# Patient Record
Sex: Female | Born: 1945 | Race: White | Hispanic: No | Marital: Married | Smoking: Never smoker
Health system: Southern US, Community
[De-identification: ages and names within clinical notes are randomized; demographics above are authoritative.]

## PROBLEM LIST (undated history)

## (undated) DIAGNOSIS — K219 Gastro-esophageal reflux disease without esophagitis: Secondary | ICD-10-CM

## (undated) DIAGNOSIS — I471 Supraventricular tachycardia, unspecified: Secondary | ICD-10-CM

## (undated) DIAGNOSIS — F419 Anxiety disorder, unspecified: Secondary | ICD-10-CM

## (undated) DIAGNOSIS — L719 Rosacea, unspecified: Secondary | ICD-10-CM

## (undated) DIAGNOSIS — M5430 Sciatica, unspecified side: Secondary | ICD-10-CM

## (undated) DIAGNOSIS — K589 Irritable bowel syndrome without diarrhea: Secondary | ICD-10-CM

## (undated) DIAGNOSIS — N809 Endometriosis, unspecified: Secondary | ICD-10-CM

## (undated) DIAGNOSIS — M1990 Unspecified osteoarthritis, unspecified site: Secondary | ICD-10-CM

## (undated) DIAGNOSIS — E039 Hypothyroidism, unspecified: Secondary | ICD-10-CM

## (undated) HISTORY — PX: UPPER GASTROINTESTINAL ENDOSCOPY: SHX188

## (undated) HISTORY — PX: COLONOSCOPY: SHX174

## (undated) HISTORY — PX: THYROID SURGERY: SHX805

## (undated) HISTORY — PX: DILATION AND CURETTAGE OF UTERUS: SHX78

## (undated) HISTORY — PX: TONSILLECTOMY: SUR1361

## (undated) HISTORY — PX: BREAST BIOPSY: SHX20

## (undated) HISTORY — PX: ADENOIDECTOMY: SUR15

## (undated) HISTORY — PX: ABDOMINAL HYSTERECTOMY: SHX81

## (undated) HISTORY — PX: APPENDECTOMY: SHX54

## (undated) HISTORY — PX: RIGHT OOPHORECTOMY: SHX2359

## (undated) HISTORY — PX: WISDOM TOOTH EXTRACTION: SHX21

---

## 2012-04-29 ENCOUNTER — Emergency Department (HOSPITAL_BASED_OUTPATIENT_CLINIC_OR_DEPARTMENT_OTHER)
Admission: EM | Admit: 2012-04-29 | Discharge: 2012-04-29 | Disposition: A | Discharge status: Home / Self Care | Payer: Medicare Other | Attending: Emergency Medicine | Admitting: Emergency Medicine

## 2012-04-29 ENCOUNTER — Encounter (HOSPITAL_BASED_OUTPATIENT_CLINIC_OR_DEPARTMENT_OTHER): Payer: Self-pay | Admitting: *Deleted

## 2012-04-29 ENCOUNTER — Emergency Department (HOSPITAL_BASED_OUTPATIENT_CLINIC_OR_DEPARTMENT_OTHER): Payer: Medicare Other

## 2012-04-29 DIAGNOSIS — Z79899 Other long term (current) drug therapy: Secondary | ICD-10-CM | POA: Insufficient documentation

## 2012-04-29 DIAGNOSIS — E039 Hypothyroidism, unspecified: Secondary | ICD-10-CM | POA: Insufficient documentation

## 2012-04-29 DIAGNOSIS — Y9301 Activity, walking, marching and hiking: Secondary | ICD-10-CM | POA: Insufficient documentation

## 2012-04-29 DIAGNOSIS — S8000XA Contusion of unspecified knee, initial encounter: Secondary | ICD-10-CM

## 2012-04-29 DIAGNOSIS — W1789XA Other fall from one level to another, initial encounter: Secondary | ICD-10-CM | POA: Insufficient documentation

## 2012-04-29 DIAGNOSIS — S82899A Other fracture of unspecified lower leg, initial encounter for closed fracture: Secondary | ICD-10-CM

## 2012-04-29 DIAGNOSIS — Y9229 Other specified public building as the place of occurrence of the external cause: Secondary | ICD-10-CM | POA: Insufficient documentation

## 2012-04-29 DIAGNOSIS — Z8659 Personal history of other mental and behavioral disorders: Secondary | ICD-10-CM | POA: Insufficient documentation

## 2012-04-29 DIAGNOSIS — K219 Gastro-esophageal reflux disease without esophagitis: Secondary | ICD-10-CM | POA: Insufficient documentation

## 2012-04-29 DIAGNOSIS — Y929 Unspecified place or not applicable: Secondary | ICD-10-CM | POA: Insufficient documentation

## 2012-04-29 DIAGNOSIS — Z8679 Personal history of other diseases of the circulatory system: Secondary | ICD-10-CM | POA: Insufficient documentation

## 2012-04-29 HISTORY — DX: Supraventricular tachycardia, unspecified: I47.10

## 2012-04-29 HISTORY — DX: Irritable bowel syndrome without diarrhea: K58.9

## 2012-04-29 HISTORY — DX: Anxiety disorder, unspecified: F41.9

## 2012-04-29 HISTORY — DX: Hypothyroidism, unspecified: E03.9

## 2012-04-29 HISTORY — DX: Gastro-esophageal reflux disease without esophagitis: K21.9

## 2012-04-29 HISTORY — DX: Rosacea, unspecified: L71.9

## 2012-04-29 HISTORY — DX: Sciatica, unspecified side: M54.30

## 2012-04-29 HISTORY — DX: Endometriosis, unspecified: N80.9

## 2012-04-29 HISTORY — DX: Supraventricular tachycardia: I47.1

## 2012-04-29 MED ORDER — HYDROCODONE-ACETAMINOPHEN 5-325 MG PO TABS
1.0000 | ORAL_TABLET | ORAL | Status: DC | PRN
Start: 2012-04-29 — End: 2017-02-16

## 2012-04-29 MED ORDER — ACETAMINOPHEN 325 MG PO TABS
650.0000 mg | ORAL_TABLET | Freq: Once | ORAL | Status: AC
  Administered 2012-04-29: 650 mg via ORAL
  Filled 2012-04-29: qty 2

## 2012-04-29 NOTE — ED Notes (Signed)
MD at bedside. 

## 2012-04-29 NOTE — ED Notes (Signed)
Pt d/c home with her husband- Cam walker and crutches applied by Weston Brass, EMT- ice pack given for home use- pt states she will f/u with her PCP next week

## 2012-04-29 NOTE — ED Notes (Signed)
Pt EMS transport from restaurant, tripped on curb- has swelling to right ankle and abrasions to both knees and left ankle- swelling to upper lip- no LOC or active bleeding

## 2012-04-29 NOTE — ED Provider Notes (Addendum)
History     CSN: [id number]  Arrival date & time 04/29/12  1638   First MD Initiated Contact with Patient 04/29/12 1648      Chief Complaint  Patient presents with  . Fall  . Ankle Pain    (Consider location/radiation/quality/duration/timing/severity/associated sxs/prior treatment) Patient is a 66 y.o. female presenting with fall and ankle pain. The history is provided by the patient.  Fall The accident occurred less than 1 hour ago. The fall occurred while walking (Tripped over the curb). She fell from a height of 1 to 2 ft. She landed on concrete. There was no blood loss. The point of impact was the left knee. The pain is present in the left knee (Right ankle). The pain is at a severity of 9/10. The pain is severe. She was not ambulatory at the scene. Pertinent negatives include no visual change, no bowel incontinence, no nausea, no vomiting, no headaches, no loss of consciousness and no tingling. The symptoms are aggravated by use of the injured limb, pressure on the injury and standing. She has tried nothing for the symptoms. The treatment provided no relief.  Ankle Pain  Pertinent negatives include no tingling.    Past Medical History  Diagnosis Date  . SVT (supraventricular tachycardia)   . Hypothyroid   . Anxiety   . GERD (gastroesophageal reflux disease)     No past surgical history on file.  No family history on file.  History  Substance Use Topics  . Smoking status: Not on file  . Smokeless tobacco: Not on file  . Alcohol Use:     OB History    Grav Para Term Preterm Abortions TAB SAB Ect Mult Living                  Review of Systems  Gastrointestinal: Negative for nausea, vomiting and bowel incontinence.  Neurological: Negative for tingling, loss of consciousness and headaches.  All other systems reviewed and are negative.    Allergies  Nsaids and Prednisone  Home Medications   Current Outpatient Rx  Name  Route  Sig  Dispense  Refill  .  CYCLOSPORINE 0.05 % OP EMUL      1 drop 2 (two) times daily.         Marland Kitchen NEURONTIN PO   Oral   Take by mouth.         . SYNTHROID PO   Oral   Take by mouth.         Marland Kitchen PRILOSEC PO   Oral   Take by mouth.           There were no vitals taken for this visit.  Physical Exam  Nursing note and vitals reviewed. Constitutional: She is oriented to person, place, and time. She appears well-developed and well-nourished. No distress.  HENT:  Head: Normocephalic and atraumatic.    Mouth/Throat: Oropharynx is clear and moist.       Contusion to the left upper lip  Eyes: Conjunctivae normal and EOM are normal. Pupils are equal, round, and reactive to light.  Neck: Normal range of motion. Neck supple.  Cardiovascular: Normal rate, regular rhythm and intact distal pulses.   No murmur heard. Pulmonary/Chest: Effort normal and breath sounds normal. No respiratory distress. She has no wheezes. She has no rales.  Abdominal: Soft. She exhibits no distension. There is no tenderness. There is no rebound and no guarding.  Musculoskeletal: She exhibits no edema and no tenderness.  Right hip: She exhibits tenderness. She exhibits normal range of motion, normal strength and no deformity.       Left knee: She exhibits swelling and ecchymosis. She exhibits normal range of motion, no effusion, no deformity, normal alignment, no LCL laxity and no MCL laxity. tenderness found. Medial joint line and lateral joint line tenderness noted.       Right ankle: She exhibits decreased range of motion, swelling, ecchymosis and deformity. tenderness. Lateral malleolus tenderness found.       Cervical back: Normal.       Thoracic back: Normal.       Lumbar back: Normal.       Legs: Neurological: She is alert and oriented to person, place, and time.  Skin: Skin is warm and dry. No rash noted. No erythema.  Psychiatric: She has a normal mood and affect. Her behavior is normal.    ED Course  Procedures  (including critical care time)  Labs Reviewed - No data to display Dg Ankle Complete Right  04/29/2012  *RADIOLOGY REPORT*  Clinical Data: Lateral ankle pain and swelling after fall.  RIGHT ANKLE - COMPLETE 3+ VIEW  Comparison: None.  Findings: There is a small avulsion from the tip of the lateral malleolus.  There is soft tissue swelling with an ankle effusion. No other abnormalities.  IMPRESSION: Small avulsion from the tip of the lateral malleolus.  Ankle effusion.   Original Report Authenticated By: Francene Boyers, M.D.    Dg Knee Complete 4 Views Left  04/29/2012  *RADIOLOGY REPORT*  Clinical Data: Knee injury, pain, and bruising.  LEFT KNEE - COMPLETE 4+ VIEW  Comparison:  None.  Findings:  There is no evidence of fracture, dislocation, or joint effusion.  There is no evidence of arthropathy or other focal bone abnormality.  Soft tissues are unremarkable.  IMPRESSION: Negative.   Original Report Authenticated By: Myles Rosenthal, M.D.      1. Avulsion fracture of ankle   2. Knee contusion       MDM   Patient with a mechanical fall today where she tripped over the curb and fell twisting her right ankle and landing on her left knee. Pain and ecchymosis over the left knee and right ankle. Also contusion to the upper lip but no dental injury. No C-spine tenderness. No head injury or LOC. Patient is not on anticoagulation. Plain films of the left knee and right ankle pending  5:59 PM Plain films significant for lateral malleolus avulsion fx.  Will place in a cam walker and give pain control.  Knee wnl.      Gwyneth Sprout, MD 04/29/12 1759  Gwyneth Sprout, MD 04/29/12 1610

## 2017-02-15 ENCOUNTER — Inpatient Hospital Stay (HOSPITAL_BASED_OUTPATIENT_CLINIC_OR_DEPARTMENT_OTHER)
Admission: EM | Admit: 2017-02-15 | Discharge: 2017-02-24 | DRG: 392 | Disposition: A | Discharge status: Nursing Home (Includes ICF) | Payer: Medicare Other | Attending: Family Medicine | Admitting: Family Medicine

## 2017-02-15 ENCOUNTER — Encounter (HOSPITAL_BASED_OUTPATIENT_CLINIC_OR_DEPARTMENT_OTHER): Payer: Self-pay

## 2017-02-15 DIAGNOSIS — K56609 Unspecified intestinal obstruction, unspecified as to partial versus complete obstruction: Secondary | ICD-10-CM | POA: Diagnosis present

## 2017-02-15 DIAGNOSIS — M7989 Other specified soft tissue disorders: Secondary | ICD-10-CM | POA: Diagnosis present

## 2017-02-15 DIAGNOSIS — Z7951 Long term (current) use of inhaled steroids: Secondary | ICD-10-CM

## 2017-02-15 DIAGNOSIS — E876 Hypokalemia: Secondary | ICD-10-CM | POA: Diagnosis present

## 2017-02-15 DIAGNOSIS — F419 Anxiety disorder, unspecified: Secondary | ICD-10-CM | POA: Diagnosis present

## 2017-02-15 DIAGNOSIS — Z8542 Personal history of malignant neoplasm of other parts of uterus: Secondary | ICD-10-CM

## 2017-02-15 DIAGNOSIS — Z978 Presence of other specified devices: Secondary | ICD-10-CM

## 2017-02-15 DIAGNOSIS — R109 Unspecified abdominal pain: Secondary | ICD-10-CM

## 2017-02-15 DIAGNOSIS — R112 Nausea with vomiting, unspecified: Secondary | ICD-10-CM | POA: Diagnosis not present

## 2017-02-15 DIAGNOSIS — R52 Pain, unspecified: Secondary | ICD-10-CM

## 2017-02-15 DIAGNOSIS — Z90721 Acquired absence of ovaries, unilateral: Secondary | ICD-10-CM

## 2017-02-15 DIAGNOSIS — E039 Hypothyroidism, unspecified: Secondary | ICD-10-CM | POA: Diagnosis present

## 2017-02-15 DIAGNOSIS — Z90710 Acquired absence of both cervix and uterus: Secondary | ICD-10-CM

## 2017-02-15 DIAGNOSIS — K529 Noninfective gastroenteritis and colitis, unspecified: Secondary | ICD-10-CM

## 2017-02-15 DIAGNOSIS — F329 Major depressive disorder, single episode, unspecified: Secondary | ICD-10-CM | POA: Diagnosis present

## 2017-02-15 DIAGNOSIS — A09 Infectious gastroenteritis and colitis, unspecified: Principal | ICD-10-CM | POA: Diagnosis present

## 2017-02-15 DIAGNOSIS — K589 Irritable bowel syndrome without diarrhea: Secondary | ICD-10-CM | POA: Diagnosis present

## 2017-02-15 DIAGNOSIS — K219 Gastro-esophageal reflux disease without esophagitis: Secondary | ICD-10-CM | POA: Diagnosis present

## 2017-02-15 HISTORY — DX: Unspecified osteoarthritis, unspecified site: M19.90

## 2017-02-15 MED ORDER — FENTANYL CITRATE (PF) 100 MCG/2ML IJ SOLN
50.0000 ug | Freq: Once | INTRAMUSCULAR | Status: AC
  Administered 2017-02-16: 50 ug via INTRAVENOUS
  Filled 2017-02-15: qty 2

## 2017-02-15 MED ORDER — SODIUM CHLORIDE 0.9 % IV SOLN
INTRAVENOUS | Status: DC
  Administered 2017-02-16 – 2017-02-20 (×9): via INTRAVENOUS

## 2017-02-15 MED ORDER — ONDANSETRON HCL 4 MG/2ML IJ SOLN
4.0000 mg | Freq: Once | INTRAMUSCULAR | Status: AC
  Administered 2017-02-16: 4 mg via INTRAVENOUS
  Filled 2017-02-15: qty 2

## 2017-02-15 NOTE — ED Triage Notes (Signed)
Pt states took miralax this evening and since 1900 vomit x2 and diarrhea x4

## 2017-02-15 NOTE — ED Notes (Signed)
Per [facility] pt from [facility] independent living; pt c/o n/v/d since 1900

## 2017-02-15 NOTE — ED Provider Notes (Signed)
[facility] Provider Note: Lowella Dell, MD, FACEP  CSN: [id number] MRN: [id number] ARRIVAL: 02/15/17 at 2336 ROOM: MH04/MH04   CHIEF COMPLAINT  Abdominal Pain   HISTORY OF PRESENT ILLNESS  02/15/17 11:45 PM Cheyenne Cole is a 71 y.o. female who developed left lower quadrant abdominal pain about 8 PM. She describes the pain as crampy and rates it an 8 out of 10. It is somewhat worse with movement or palpation. She has had about 2 episodes of dry heaves followed by episodes of vomiting. She threw up very little material. She is also had several bowel movements all but one of which was performed; the most recent one was loose. She has not had a fever. She denies chest pain or shortness of breath. She had taken MiraLAX earlier because of the sensation that she needed to void her bowels.   Past Medical History:  Diagnosis Date  . Anxiety   . Arthritis   . Endometriosis   . GERD (gastroesophageal reflux disease)   . Hypothyroid   . IBS (irritable bowel syndrome)   . Rosacea   . Sciatica   . SVT (supraventricular tachycardia) (HCC)     Past Surgical History:  Procedure Laterality Date  . ABDOMINAL HYSTERECTOMY    . ADENOIDECTOMY    . APPENDECTOMY    . BREAST BIOPSY    . COLONOSCOPY    . DILATION AND CURETTAGE OF UTERUS    . RIGHT OOPHORECTOMY    . THYROID SURGERY    . TONSILLECTOMY    . UPPER GASTROINTESTINAL ENDOSCOPY    . WISDOM TOOTH EXTRACTION      No family history on file.  Social History  Substance Use Topics  . Smoking status: Never Smoker  . Smokeless tobacco: Never Used  . Alcohol use 0.0 oz/week    Prior to Admission medications   Medication Sig Start Date End Date Taking? Authorizing Provider  acetaminophen (TYLENOL) 325 MG tablet Take 650 mg by mouth every 6 (six) hours as needed.    [provider]  busPIRone (BUSPAR) 5 MG tablet Take 5 mg by mouth 2 (two) times daily.    [provider]  chlorpheniramine  (CHLOR-TRIMETON) 2 MG/5ML syrup Take 2 mg by mouth every 4 (four) hours as needed.    [provider]  Cholecalciferol (VITAMIN D PO) Take 1,700 Int'l Units by mouth daily.    [provider]  cycloSPORINE (RESTASIS) 0.05 % ophthalmic emulsion 1 drop daily.     [provider]  Docusate Calcium (STOOL SOFTENER PO) Take by mouth.    [provider]  escitalopram (LEXAPRO) 10 MG tablet Take 10 mg by mouth 2 (two) times daily before a meal.  with evening dose    [provider]  FIBER FORMULA PO Take by mouth.    [provider]  fluticasone (FLONASE) 50 MCG/ACT nasal spray Place 2 sprays into the nose daily.    [provider]  Gabapentin (NEURONTIN PO) Take 200 mg by mouth every evening.     [provider]  HYDROcodone-acetaminophen (NORCO/VICODIN) 5-325 MG per tablet Take 1 tablet by mouth every 4 (four) hours as needed for pain. 04/29/12   Gwyneth Sprout, MD  HYDROXYZINE HCL PO Take by mouth.    [provider]  ibuprofen (ADVIL,MOTRIN) 200 MG tablet Take 200 mg by mouth every 6 (six) hours as needed.    [provider]  Levothyroxine Sodium (SYNTHROID PO) Take 500 mcg by mouth daily.  [provider]  Melatonin 3 MG TABS Take 3 mg by mouth every evening.    [provider]  MetroNIDAZOLE (METROGEL EX) Apply topically daily.    [provider]  Omeprazole (PRILOSEC PO) Take 20 mg by mouth daily.     [provider]  polyethylene glycol (MIRALAX / GLYCOLAX) packet Take 17 g by mouth daily.    [provider]  Polyvinyl Alcohol-Povidone (REFRESH OP) Apply to eye.    [provider]  Probiotic Product (PROBIOTIC DAILY PO) Take by mouth.    [provider]  sodium chloride (OCEAN) 0.65 % nasal spray Place 1 spray into the nose as needed.    [provider]  TRIAMCINOLONE & EMOLLIENT EX Apply topically.    [provider]    VALACYCLOVIR HCL PO Take by mouth.    [provider]    Allergies Ciprofloxacin; Nsaids; Prednisone; and Wellbutrin [bupropion]   REVIEW OF SYSTEMS  Negative except as noted here or in the History of Present Illness.   PHYSICAL EXAMINATION  Initial Vital Signs There were no vitals taken for this visit.  Examination General: Well-developed, well-nourished female in no acute distress; appearance consistent with age of record HENT: normocephalic; atraumatic Eyes: pupils equal, round and reactive to light; extraocular muscles intact Neck: supple Heart: regular rate and rhythm Lungs: clear to auscultation bilaterally Abdomen: soft; nondistended; left lower quadrant tenderness; no masses or hepatosplenomegaly; bowel sounds hypoactive Extremities: No deformity; full range of motion; pulses normal Neurologic: Awake, alert and oriented; motor function intact in all extremities and symmetric; no facial droop Skin: Warm and dry Psychiatric: Flat affect   RESULTS  Summary of this visit's results, reviewed by myself:   EKG Interpretation  Date/Time:    Ventricular Rate:    PR Interval:    QRS Duration:   QT Interval:    QTC Calculation:   R Axis:     Text Interpretation:        Laboratory Studies: Results for orders placed or performed during the hospital encounter of 02/15/17 (from the past 24 hour(s))  CBC with Differential/Platelet     Status: None   Collection Time: 02/16/17 12:02 AM  Result Value Ref Range   WBC 8.7 4.0 - 10.5 K/uL   RBC 4.38 3.87 - 5.11 MIL/uL   Hemoglobin 13.7 12.0 - 15.0 g/dL   HCT 16.1 09.6 - 04.5 %   MCV 92.0 78.0 - 100.0 fL   MCH 31.3 26.0 - 34.0 pg   MCHC 34.0 30.0 - 36.0 g/dL   RDW 40.9 81.1 - 91.4 %   Platelets 249 150 - 400 K/uL   Neutrophils Relative % 80 %   Neutro Abs 6.9 1.7 - 7.7 K/uL   Lymphocytes Relative 11 %   Lymphs Abs 1.0 0.7 - 4.0 K/uL   Monocytes Relative 7 %   Monocytes Absolute 0.6 0.1 - 1.0 K/uL    Eosinophils Relative 1 %   Eosinophils Absolute 0.1 0.0 - 0.7 K/uL   Basophils Relative 1 %   Basophils Absolute 0.1 0.0 - 0.1 K/uL  Basic metabolic panel     Status: Abnormal   Collection Time: 02/16/17 12:02 AM  Result Value Ref Range   Sodium 132 (L) 135 - 145 mmol/L   Potassium 2.8 (L) 3.5 - 5.1 mmol/L   Chloride 94 (L) 101 - 111 mmol/L   CO2 28 22 - 32 mmol/L   Glucose, Bld 150 (H) 65 - 99 mg/dL   BUN  17 6 - 20 mg/dL   Creatinine, Ser 1.61 0.44 - 1.00 mg/dL   Calcium 9.1 8.9 - 09.6 mg/dL   GFR calc non Af Amer >60 >60 mL/min   GFR calc Af Amer >60 >60 mL/min   Anion gap 10 5 - 15  Urinalysis, Routine w reflex microscopic     Status: Abnormal   Collection Time: 02/16/17 12:35 AM  Result Value Ref Range   Color, Urine YELLOW YELLOW   APPearance CLOUDY (A) CLEAR   Specific Gravity, Urine 1.010 1.005 - 1.030   pH 8.5 (H) 5.0 - 8.0   Glucose, UA NEGATIVE NEGATIVE mg/dL   Hgb urine dipstick SMALL (A) NEGATIVE   Bilirubin Urine NEGATIVE NEGATIVE   Ketones, ur 15 (A) NEGATIVE mg/dL   Protein, ur NEGATIVE NEGATIVE mg/dL   Nitrite NEGATIVE NEGATIVE   Leukocytes, UA SMALL (A) NEGATIVE  Urinalysis, Microscopic (reflex)     Status: Abnormal   Collection Time: 02/16/17 12:35 AM  Result Value Ref Range   RBC / HPF 0-5 0 - 5 RBC/hpf   WBC, UA 0-5 0 - 5 WBC/hpf   Bacteria, UA FEW (A) NONE SEEN   Squamous Epithelial / LPF 6-30 (A) NONE SEEN   Amorphous Crystal PRESENT    Imaging Studies: Ct Abdomen Pelvis W Contrast  Result Date: 02/16/2017 CLINICAL DATA:  Nausea, vomiting, and diarrhea. Bloating. Prior colonoscopy with polyp removal. History of uterine cancer. Hematuria. EXAM: CT ABDOMEN AND PELVIS WITH CONTRAST TECHNIQUE: Multidetector CT imaging of the abdomen and pelvis was performed using the standard protocol following bolus administration of intravenous contrast. CONTRAST:  ISOVUE-300 IOPAMIDOL (ISOVUE-300) INJECTION 61% COMPARISON:  None. FINDINGS: Lower chest: The lung  bases are clear. Hepatobiliary: No focal liver abnormality is seen. No gallstones, gallbladder wall thickening, or biliary dilatation. Pancreas: Unremarkable. No pancreatic ductal dilatation or surrounding inflammatory changes. Spleen: Normal in size without focal abnormality. Adrenals/Urinary Tract: Adrenal glands are unremarkable. Kidneys are normal, without renal calculi, focal lesion, or hydronephrosis. Bladder is unremarkable. Stomach/Bowel: Stomach is not abnormally distended and no wall thickening is appreciated. There is mild a distention of fluid-filled small bowel particularly in the lower abdomen and pelvis. There is mild bowel wall thickening with a prominent thick walled bowel loop in the pelvis. There is decompression of the terminal ileum with transition zone in the pelvis or right lower quadrant. Changes are consistent with small bowel obstruction. Wall thickening suggests superimposed or pre-existing enteritis or inflammatory bowel disease. Can't exclude stricture from Crohn's disease. No pneumatosis or portal venous gas to suggest ischemia. Colon is mostly decompressed. No inflammatory changes in the colon. Appendix is not identified. Vascular/Lymphatic: Aortic atherosclerosis. No enlarged abdominal or pelvic lymph nodes. Reproductive: Status post hysterectomy. No adnexal masses. Other: No free air in the abdomen. Small amount of free fluid around the liver, possibly indicating ascites. Musculoskeletal: Lumbar scoliosis convex towards the right. No destructive bone lesions. IMPRESSION: 1. Mildly dilated fluid-filled small bowel with distal decompression and small bowel wall thickening. Changes consistent with small bowel obstruction. Coexisting enteritis. Can't exclude stricture due to Crohn's disease. Transition zone in the right lower quadrant pelvis. 2. No evidence of diverticulitis. 3. Aortic atherosclerosis. 4. Small amount of ascites around the liver. Electronically Signed   By: Burman Nieves M.D.   On: 02/16/2017 02:16    ED COURSE  Nursing notes and initial vitals signs, including pulse oximetry, reviewed.  Vitals:   02/15/17 2345  BP: (!) 165/85  Pulse: 77  Resp: 18  Temp: 97.7 F (36.5 C)  TempSrc: Oral  SpO2: 100%    PROCEDURES    ED DIAGNOSES     ICD-10-CM   1. Small bowel obstruction (HCC) K56.609   2. Enteritis K52.9   3. Hypokalemia due to loss of potassium E87.6        Milany Geck, Jonny Ruiz, MD 02/16/17 [phone]

## 2017-02-16 ENCOUNTER — Emergency Department (HOSPITAL_BASED_OUTPATIENT_CLINIC_OR_DEPARTMENT_OTHER): Payer: Medicare Other

## 2017-02-16 DIAGNOSIS — A09 Infectious gastroenteritis and colitis, unspecified: Secondary | ICD-10-CM | POA: Diagnosis present

## 2017-02-16 DIAGNOSIS — E876 Hypokalemia: Secondary | ICD-10-CM | POA: Diagnosis present

## 2017-02-16 DIAGNOSIS — M7989 Other specified soft tissue disorders: Secondary | ICD-10-CM | POA: Diagnosis present

## 2017-02-16 DIAGNOSIS — K219 Gastro-esophageal reflux disease without esophagitis: Secondary | ICD-10-CM | POA: Diagnosis present

## 2017-02-16 DIAGNOSIS — R52 Pain, unspecified: Secondary | ICD-10-CM | POA: Diagnosis not present

## 2017-02-16 DIAGNOSIS — Z90721 Acquired absence of ovaries, unilateral: Secondary | ICD-10-CM | POA: Diagnosis not present

## 2017-02-16 DIAGNOSIS — Z978 Presence of other specified devices: Secondary | ICD-10-CM | POA: Diagnosis not present

## 2017-02-16 DIAGNOSIS — E039 Hypothyroidism, unspecified: Secondary | ICD-10-CM | POA: Diagnosis present

## 2017-02-16 DIAGNOSIS — K56609 Unspecified intestinal obstruction, unspecified as to partial versus complete obstruction: Secondary | ICD-10-CM | POA: Diagnosis present

## 2017-02-16 DIAGNOSIS — Z8542 Personal history of malignant neoplasm of other parts of uterus: Secondary | ICD-10-CM | POA: Diagnosis not present

## 2017-02-16 DIAGNOSIS — K529 Noninfective gastroenteritis and colitis, unspecified: Secondary | ICD-10-CM | POA: Diagnosis not present

## 2017-02-16 DIAGNOSIS — F329 Major depressive disorder, single episode, unspecified: Secondary | ICD-10-CM | POA: Diagnosis present

## 2017-02-16 DIAGNOSIS — R112 Nausea with vomiting, unspecified: Secondary | ICD-10-CM | POA: Diagnosis present

## 2017-02-16 DIAGNOSIS — Z90710 Acquired absence of both cervix and uterus: Secondary | ICD-10-CM | POA: Diagnosis not present

## 2017-02-16 DIAGNOSIS — F419 Anxiety disorder, unspecified: Secondary | ICD-10-CM | POA: Diagnosis present

## 2017-02-16 DIAGNOSIS — Z7951 Long term (current) use of inhaled steroids: Secondary | ICD-10-CM | POA: Diagnosis not present

## 2017-02-16 DIAGNOSIS — R1084 Generalized abdominal pain: Secondary | ICD-10-CM | POA: Diagnosis not present

## 2017-02-16 DIAGNOSIS — K589 Irritable bowel syndrome without diarrhea: Secondary | ICD-10-CM | POA: Diagnosis present

## 2017-02-16 DIAGNOSIS — R109 Unspecified abdominal pain: Secondary | ICD-10-CM | POA: Diagnosis not present

## 2017-02-16 LAB — URINALYSIS, ROUTINE W REFLEX MICROSCOPIC
BILIRUBIN URINE: NEGATIVE
Glucose, UA: NEGATIVE mg/dL
Ketones, ur: 15 mg/dL — AB
NITRITE: NEGATIVE
PH: 8.5 — AB (ref 5.0–8.0)
Protein, ur: NEGATIVE mg/dL
SPECIFIC GRAVITY, URINE: 1.01 (ref 1.005–1.030)

## 2017-02-16 LAB — CBC
HCT: 44.7 % (ref 36.0–46.0)
Hemoglobin: 15.6 g/dL — ABNORMAL HIGH (ref 12.0–15.0)
MCH: 31.8 pg (ref 26.0–34.0)
MCHC: 34.9 g/dL (ref 30.0–36.0)
MCV: 91 fL (ref 78.0–100.0)
Platelets: 226 10*3/uL (ref 150–400)
RBC: 4.91 MIL/uL (ref 3.87–5.11)
RDW: 13.5 % (ref 11.5–15.5)
WBC: 22.3 10*3/uL — AB (ref 4.0–10.5)

## 2017-02-16 LAB — CBC WITH DIFFERENTIAL/PLATELET
BASOS PCT: 1 %
Basophils Absolute: 0.1 10*3/uL (ref 0.0–0.1)
Eosinophils Absolute: 0.1 10*3/uL (ref 0.0–0.7)
Eosinophils Relative: 1 %
HEMATOCRIT: 40.3 % (ref 36.0–46.0)
HEMOGLOBIN: 13.7 g/dL (ref 12.0–15.0)
Lymphocytes Relative: 11 %
Lymphs Abs: 1 10*3/uL (ref 0.7–4.0)
MCH: 31.3 pg (ref 26.0–34.0)
MCHC: 34 g/dL (ref 30.0–36.0)
MCV: 92 fL (ref 78.0–100.0)
MONOS PCT: 7 %
Monocytes Absolute: 0.6 10*3/uL (ref 0.1–1.0)
NEUTROS ABS: 6.9 10*3/uL (ref 1.7–7.7)
NEUTROS PCT: 80 %
Platelets: 249 10*3/uL (ref 150–400)
RBC: 4.38 MIL/uL (ref 3.87–5.11)
RDW: 12.9 % (ref 11.5–15.5)
WBC: 8.7 10*3/uL (ref 4.0–10.5)

## 2017-02-16 LAB — LACTIC ACID, PLASMA: Lactic Acid, Venous: 2 mmol/L (ref 0.5–1.9)

## 2017-02-16 LAB — HEPATIC FUNCTION PANEL
ALT: 18 U/L (ref 14–54)
AST: 27 U/L (ref 15–41)
Albumin: 4.4 g/dL (ref 3.5–5.0)
Alkaline Phosphatase: 72 U/L (ref 38–126)
TOTAL PROTEIN: 7.6 g/dL (ref 6.5–8.1)
Total Bilirubin: 0.3 mg/dL (ref 0.3–1.2)

## 2017-02-16 LAB — BASIC METABOLIC PANEL
Anion gap: 10 (ref 5–15)
BUN: 17 mg/dL (ref 6–20)
CHLORIDE: 94 mmol/L — AB (ref 101–111)
CO2: 28 mmol/L (ref 22–32)
CREATININE: 0.52 mg/dL (ref 0.44–1.00)
Calcium: 9.1 mg/dL (ref 8.9–10.3)
GFR calc Af Amer: >60 mL/min (ref >60)
GFR calc non Af Amer: >60 mL/min (ref >60)
Glucose, Bld: 150 mg/dL — ABNORMAL HIGH (ref 65–99)
POTASSIUM: 2.8 mmol/L — AB (ref 3.5–5.1)
SODIUM: 132 mmol/L — AB (ref 135–145)

## 2017-02-16 LAB — TYPE AND SCREEN
ABO/RH(D): A POS
Antibody Screen: NEGATIVE

## 2017-02-16 LAB — URINALYSIS, MICROSCOPIC (REFLEX)

## 2017-02-16 MED ORDER — CYCLOSPORINE 0.05 % OP EMUL
1.0000 [drp] | Freq: Two times a day (BID) | OPHTHALMIC | Status: DC
  Filled 2017-02-16: qty 1

## 2017-02-16 MED ORDER — LEVOTHYROXINE SODIUM 50 MCG PO TABS
50.0000 ug | ORAL_TABLET | Freq: Every day | ORAL | Status: DC
  Administered 2017-02-16 – 2017-02-21 (×6): 50 ug via ORAL
  Filled 2017-02-16 (×6): qty 1

## 2017-02-16 MED ORDER — ACETAMINOPHEN 325 MG PO TABS
650.0000 mg | ORAL_TABLET | Freq: Four times a day (QID) | ORAL | Status: DC | PRN
Start: 2017-02-16 — End: 2017-02-24
  Administered 2017-02-16 – 2017-02-22 (×10): 650 mg via ORAL
  Filled 2017-02-16 (×10): qty 2

## 2017-02-16 MED ORDER — METRONIDAZOLE IN NACL 5-0.79 MG/ML-% IV SOLN
500.0000 mg | Freq: Three times a day (TID) | INTRAVENOUS | Status: DC
  Administered 2017-02-16 – 2017-02-23 (×22): 500 mg via INTRAVENOUS
  Filled 2017-02-16 (×23): qty 100

## 2017-02-16 MED ORDER — ONDANSETRON HCL 4 MG PO TABS
4.0000 mg | ORAL_TABLET | Freq: Four times a day (QID) | ORAL | Status: DC | PRN
  Administered 2017-02-19: 4 mg via ORAL
  Filled 2017-02-16: qty 1

## 2017-02-16 MED ORDER — CYCLOSPORINE 0.05 % OP EMUL
1.0000 [drp] | Freq: Two times a day (BID) | OPHTHALMIC | Status: DC
  Administered 2017-02-16 – 2017-02-24 (×14): 1 [drp] via OPHTHALMIC
  Filled 2017-02-16 (×18): qty 1

## 2017-02-16 MED ORDER — SODIUM CHLORIDE 0.9 % IV BOLUS (SEPSIS)
250.0000 mL | Freq: Once | INTRAVENOUS | Status: AC
  Administered 2017-02-16: 250 mL via INTRAVENOUS

## 2017-02-16 MED ORDER — IOPAMIDOL (ISOVUE-300) INJECTION 61%
100.0000 mL | Freq: Once | INTRAVENOUS | Status: AC | PRN
  Administered 2017-02-16: 100 mL via INTRAVENOUS

## 2017-02-16 MED ORDER — HYDROMORPHONE HCL 1 MG/ML IJ SOLN
0.5000 mg | INTRAMUSCULAR | Status: DC | PRN
Start: 2017-02-16 — End: 2017-02-24
  Administered 2017-02-16 – 2017-02-22 (×11): 0.5 mg via INTRAVENOUS
  Filled 2017-02-16 (×11): qty 1

## 2017-02-16 MED ORDER — PROMETHAZINE HCL 25 MG/ML IJ SOLN
12.5000 mg | Freq: Once | INTRAMUSCULAR | Status: AC
  Administered 2017-02-16: 12.5 mg via INTRAVENOUS

## 2017-02-16 MED ORDER — CEFTRIAXONE SODIUM IN DEXTROSE 40 MG/ML IV SOLN
2.0000 g | INTRAVENOUS | Status: DC
  Administered 2017-02-16 – 2017-02-23 (×8): 2 g via INTRAVENOUS
  Filled 2017-02-16 (×9): qty 50

## 2017-02-16 MED ORDER — ONDANSETRON HCL 4 MG/2ML IJ SOLN
4.0000 mg | Freq: Four times a day (QID) | INTRAMUSCULAR | Status: DC | PRN
  Administered 2017-02-16 – 2017-02-21 (×6): 4 mg via INTRAVENOUS
  Filled 2017-02-16 (×6): qty 2

## 2017-02-16 MED ORDER — ACETAMINOPHEN 650 MG RE SUPP: 650.0000 mg | Freq: Four times a day (QID) | RECTAL | Status: DC | PRN

## 2017-02-16 MED ORDER — DORZOLAMIDE HCL-TIMOLOL MAL 2-0.5 % OP SOLN
1.0000 [drp] | Freq: Two times a day (BID) | OPHTHALMIC | Status: DC
  Administered 2017-02-16 – 2017-02-24 (×17): 1 [drp] via OPHTHALMIC
  Filled 2017-02-16: qty 10

## 2017-02-16 MED ORDER — PROMETHAZINE HCL 25 MG/ML IJ SOLN
INTRAMUSCULAR | Status: AC
  Administered 2017-02-16: 12.5 mg via INTRAVENOUS
  Filled 2017-02-16: qty 1

## 2017-02-16 MED ORDER — POTASSIUM CHLORIDE 10 MEQ/100ML IV SOLN
10.0000 meq | INTRAVENOUS | Status: AC
  Administered 2017-02-16 (×4): 10 meq via INTRAVENOUS
  Filled 2017-02-16 (×4): qty 100

## 2017-02-16 MED ORDER — ENOXAPARIN SODIUM 40 MG/0.4ML ~~LOC~~ SOLN
40.0000 mg | SUBCUTANEOUS | Status: DC
  Administered 2017-02-16: 40 mg via SUBCUTANEOUS
  Filled 2017-02-16: qty 0.4

## 2017-02-16 MED ORDER — FENTANYL CITRATE (PF) 100 MCG/2ML IJ SOLN
100.0000 ug | Freq: Once | INTRAMUSCULAR | Status: AC
  Administered 2017-02-16: 100 ug via INTRAVENOUS

## 2017-02-16 MED ORDER — FENTANYL CITRATE (PF) 100 MCG/2ML IJ SOLN
INTRAMUSCULAR | Status: AC
  Filled 2017-02-16: qty 2

## 2017-02-16 MED ORDER — POLYVINYL ALCOHOL 1.4 % OP SOLN
1.0000 [drp] | Freq: Every day | OPHTHALMIC | Status: DC | PRN
  Administered 2017-02-16 – 2017-02-17 (×3): 1 [drp] via OPHTHALMIC
  Filled 2017-02-16: qty 15

## 2017-02-16 NOTE — Progress Notes (Signed)
Patient admitted after midnight, for details please refer to admission note done 02/16/2017.  71-year-old female with history of IBS, uterine cancer stage I in 2009 status post TAH/SBO. [facility] presented [facility] with left lower quadrant abdominal pain, crampy, 8 out of 10 in intensity at worst, associated with dry heaving and vomiting. She reported taking Miralax because she thought she was constipated but this has not provided significant symptomatic relief. CT abdomen on the admission was suspicious for small bowel obstruction in the setting of enteritis.  Assessment and plan:  Abdominal pain, nausea and vomiting in adult / small bowel obstruction / enteritis - Continue nothing by mouth - Continue IV fluids for hydration - Patient started on empiric Rocephin and Flagyl - Continue pain management efforts  Manson [facility] [phone]

## 2017-02-16 NOTE — Progress Notes (Signed)
PHARMACY NOTE:  ANTIMICROBIAL  DOSAGE ADJUSTMENT  Current antimicrobial regimen includes a mismatch between antimicrobial dosage and indication.  As per policy approved by the Pharmacy & Therapeutics and Medical Executive Committees, the antimicrobial dosage will be adjusted accordingly.  Current antimicrobial dosage:  Rocephin 1 Gm IV q24h  Indication: Intra-abdominal infection     Antimicrobial dosage has been changed to:  Rocephin 2 Gm IV q24h    Thank you for allowing pharmacy to be a part of this patient's care.  Lorenza Evangelist, [facility] 02/16/2017 5:48 AM

## 2017-02-16 NOTE — Progress Notes (Signed)
CRITICAL VALUE STICKER  CRITICAL VALUE: Lactic Acid 2.0  RECEIVER (on-site recipient of call): Edsel Petrin  DATE & TIME NOTIFIED: 02/16/17     2121  MESSENGER (representative from lab): Christy Sartorius  MD NOTIFIED: Toniann Fail  TIME OF NOTIFICATION: 2138  RESPONSE: 250cc Bolus, Labs

## 2017-02-16 NOTE — H&P (Addendum)
History and Physical    Deltha Bernales ZOX:[id number] DOB: 05-May-1946 DOA: 02/15/2017  PCP: Nadara Eaton, MD  Patient coming from: Home  I have personally briefly reviewed patient's old medical records in [facility]  Chief Complaint: Abd pain, N/V  HPI: Dorissa Stinnette is a 71 y.o. female with medical history significant of IBS, colon polyps, uterine cancer stage 1A in 2009 s/p TAH/BSO.  Patient presents to the ED at [facility] with c/o LLQ abd pain.  Symptoms onset at about 8pm, pain is crampy, 8/10 at worst.  2 episodes of dry heaves followed by vomiting.  Very little total emesis.  Tried taking miralax because she thought she was constipated.  Since then has had several small BMs but pain persists.   ED Course: CT abd / pelvis suspicious for SBO in setting of enteritis.   Review of Systems: As per HPI otherwise 10 point review of systems negative.   Past Medical History:  Diagnosis Date  . Anxiety   . Arthritis   . Endometriosis   . GERD (gastroesophageal reflux disease)   . Hypothyroid   . IBS (irritable bowel syndrome)   . Rosacea   . Sciatica   . SVT (supraventricular tachycardia) (HCC)     Past Surgical History:  Procedure Laterality Date  . ABDOMINAL HYSTERECTOMY    . ADENOIDECTOMY    . APPENDECTOMY    . BREAST BIOPSY    . COLONOSCOPY    . DILATION AND CURETTAGE OF UTERUS    . RIGHT OOPHORECTOMY    . THYROID SURGERY    . TONSILLECTOMY    . UPPER GASTROINTESTINAL ENDOSCOPY    . WISDOM TOOTH EXTRACTION       reports that she has never smoked. She has never used smokeless tobacco. She reports that she drinks alcohol. She reports that she does not use drugs.  Allergies  Allergen Reactions  . Ciprofloxacin Swelling    Tongue swelling  . Nsaids Other (See Comments)    Gi upsets; flushes; acid reflux ONLY can take ibuprofen( 1 low dose in one week)  . Prednisone Other (See Comments)     Allergic to steroids Can take low doses; increased flushing Allergic  to steroids  . Wellbutrin [Bupropion]     unknown    No family history on file.   Prior to Admission medications   Medication Sig Start Date End Date Taking? Authorizing Provider  acetaminophen (TYLENOL) 325 MG tablet Take 650 mg by mouth every 6 (six) hours as needed.    [provider]  busPIRone (BUSPAR) 5 MG tablet Take 5 mg by mouth 2 (two) times daily.    [provider]  chlorpheniramine (CHLOR-TRIMETON) 2 MG/5ML syrup Take 2 mg by mouth every 4 (four) hours as needed.    [provider]  Cholecalciferol (VITAMIN D PO) Take 1,700 Int'l Units by mouth daily.    [provider]  cycloSPORINE (RESTASIS) 0.05 % ophthalmic emulsion 1 drop daily.     [provider]  Docusate Calcium (STOOL SOFTENER PO) Take by mouth.    [provider]  escitalopram (LEXAPRO) 10 MG tablet Take 10 mg by mouth 2 (two) times daily before a meal.  with evening dose    [provider]  FIBER FORMULA PO Take by mouth.    [provider]  fluticasone (FLONASE) 50 MCG/ACT nasal spray Place 2 sprays into the nose daily.    [provider]  Gabapentin (NEURONTIN PO) Take 200 mg by  mouth every evening.     [provider]  HYDROcodone-acetaminophen (NORCO/VICODIN) 5-325 MG per tablet Take 1 tablet by mouth every 4 (four) hours as needed for pain. 04/29/12   Gwyneth Sprout, MD  HYDROXYZINE HCL PO Take by mouth.    [provider]  ibuprofen (ADVIL,MOTRIN) 200 MG tablet Take 200 mg by mouth every 6 (six) hours as needed.    [provider]  Levothyroxine Sodium (SYNTHROID PO) Take 500 mcg by mouth daily.     [provider]  Melatonin 3 MG TABS Take 3 mg by mouth every evening.    [provider]  MetroNIDAZOLE (METROGEL EX) Apply topically daily.    [provider]  Omeprazole (PRILOSEC PO) Take 20 mg by mouth daily.     [provider]  polyethylene glycol (MIRALAX /  GLYCOLAX) packet Take 17 g by mouth daily.    [provider]  Polyvinyl Alcohol-Povidone (REFRESH OP) Apply to eye.    [provider]  Probiotic Product (PROBIOTIC DAILY PO) Take by mouth.    [provider]  sodium chloride (OCEAN) 0.65 % nasal spray Place 1 spray into the nose as needed.    [provider]  TRIAMCINOLONE & EMOLLIENT EX Apply topically.    [provider]  VALACYCLOVIR HCL PO Take by mouth.    [provider]    Physical Exam: Vitals:   02/16/17 0300 02/16/17 0304 02/16/17 0321 02/16/17 0443  BP: (!) 157/86  (!) 150/96 (!) 168/101  Pulse: 98 (!) 102 97 92  Resp: Temp:    97.6 F (36.4 C)  TempSrc:    Oral  SpO2: (!) 83% 100% 100% 100%    Constitutional: NAD, calm, comfortable Eyes: PERRL, lids and conjunctivae normal ENMT: Mucous membranes are moist. Posterior pharynx clear of any exudate or lesions.Normal dentition.  Neck: normal, supple, no masses, no thyromegaly Respiratory: clear to auscultation bilaterally, no wheezing, no crackles. Normal respiratory effort. No accessory muscle use.  Cardiovascular: Regular rate and rhythm, no murmurs / rubs / gallops. No extremity edema. 2+ pedal pulses. No carotid bruits.  Abdomen: LLQ tenderness Musculoskeletal: no clubbing / cyanosis. No joint deformity upper and lower extremities. Good ROM, no contractures. Normal muscle tone.  Skin: no rashes, lesions, ulcers. No induration Neurologic: CN 2-12 grossly intact. Sensation intact, DTR normal. Strength 5/5 in all 4.  Psychiatric: Normal judgment and insight. Alert and oriented x 3. Normal mood.    Labs on Admission: I have personally reviewed following labs and imaging studies  CBC:  Recent Labs Lab 02/16/17 0002  WBC 8.7  NEUTROABS 6.9  HGB 13.7  HCT 40.3  MCV 92.0  PLT 249   Basic Metabolic Panel:  Recent Labs Lab 02/16/17 0002  NA 132*  K 2.8*  CL 94*  CO2 28  GLUCOSE 150*  BUN 17    CREATININE 0.52  CALCIUM 9.1   GFR: CrCl cannot be calculated (Unknown ideal weight.). Liver Function Tests:  Recent Labs Lab 02/16/17 0002  AST 27  ALT 18  ALKPHOS 72  BILITOT 0.3  PROT 7.6  ALBUMIN 4.4   No results for input(s): LIPASE, AMYLASE in the last 168 hours. No results for input(s): AMMONIA in the last 168 hours. Coagulation Profile: No results for input(s): INR, PROTIME in the last 168 hours. Cardiac Enzymes: No results for input(s): CKTOTAL, CKMB, CKMBINDEX, TROPONINI in the last 168 hours. BNP (last 3 results) No results for input(s): PROBNP  in the last 8760 hours. HbA1C: No results for input(s): HGBA1C in the last 72 hours. CBG: No results for input(s): GLUCAP in the last 168 hours. Lipid Profile: No results for input(s): CHOL, HDL, LDLCALC, TRIG, CHOLHDL, LDLDIRECT in the last 72 hours. Thyroid Function Tests: No results for input(s): TSH, T4TOTAL, FREET4, T3FREE, THYROIDAB in the last 72 hours. Anemia Panel: No results for input(s): VITAMINB12, FOLATE, FERRITIN, TIBC, IRON, RETICCTPCT in the last 72 hours. Urine analysis:    Component Value Date/Time   COLORURINE YELLOW 02/16/2017 0035   APPEARANCEUR CLOUDY (A) 02/16/2017 0035   LABSPEC 1.010 02/16/2017 0035   PHURINE 8.5 (H) 02/16/2017 0035   GLUCOSEU NEGATIVE 02/16/2017 0035   HGBUR SMALL (A) 02/16/2017 0035   BILIRUBINUR NEGATIVE 02/16/2017 0035   KETONESUR 15 (A) 02/16/2017 0035   PROTEINUR NEGATIVE 02/16/2017 0035   NITRITE NEGATIVE 02/16/2017 0035   LEUKOCYTESUR SMALL (A) 02/16/2017 0035    Radiological Exams on Admission: Ct Abdomen Pelvis W Contrast  Result Date: 02/16/2017 CLINICAL DATA:  Nausea, vomiting, and diarrhea. Bloating. Prior colonoscopy with polyp removal. History of uterine cancer. Hematuria. EXAM: CT ABDOMEN AND PELVIS WITH CONTRAST TECHNIQUE: Multidetector CT imaging of the abdomen and pelvis was performed using the standard protocol following bolus administration of  intravenous contrast. CONTRAST:  ISOVUE-300 IOPAMIDOL (ISOVUE-300) INJECTION 61% COMPARISON:  None. FINDINGS: Lower chest: The lung bases are clear. Hepatobiliary: No focal liver abnormality is seen. No gallstones, gallbladder wall thickening, or biliary dilatation. Pancreas: Unremarkable. No pancreatic ductal dilatation or surrounding inflammatory changes. Spleen: Normal in size without focal abnormality. Adrenals/Urinary Tract: Adrenal glands are unremarkable. Kidneys are normal, without renal calculi, focal lesion, or hydronephrosis. Bladder is unremarkable. Stomach/Bowel: Stomach is not abnormally distended and no wall thickening is appreciated. There is mild a distention of fluid-filled small bowel particularly in the lower abdomen and pelvis. There is mild bowel wall thickening with a prominent thick walled bowel loop in the pelvis. There is decompression of the terminal ileum with transition zone in the pelvis or right lower quadrant. Changes are consistent with small bowel obstruction. Wall thickening suggests superimposed or pre-existing enteritis or inflammatory bowel disease. Can't exclude stricture from Crohn's disease. No pneumatosis or portal venous gas to suggest ischemia. Colon is mostly decompressed. No inflammatory changes in the colon. Appendix is not identified. Vascular/Lymphatic: Aortic atherosclerosis. No enlarged abdominal or pelvic lymph nodes. Reproductive: Status post hysterectomy. No adnexal masses. Other: No free air in the abdomen. Small amount of free fluid around the liver, possibly indicating ascites. Musculoskeletal: Lumbar scoliosis convex towards the right. No destructive bone lesions. IMPRESSION: 1. Mildly dilated fluid-filled small bowel with distal decompression and small bowel wall thickening. Changes consistent with small bowel obstruction. Coexisting enteritis. Can't exclude stricture due to Crohn's disease. Transition zone in the right lower quadrant pelvis. 2. No  evidence of diverticulitis. 3. Aortic atherosclerosis. 4. Small amount of ascites around the liver. Electronically Signed   By: Burman Nieves M.D.   On: 02/16/2017 02:16    EKG: Independently reviewed.  Assessment/Plan Principal Problem:   SBO (small bowel obstruction) (HCC) Active Problems:   Enteritis presumed infectious   Hypokalemia    1. SBO - likely PSBO in setting of enteritis 1. NPO 2. IVF: NS at 125 cc/hr 3. Zofran and dilaudid PRN 4. NGT if symptoms worsen, but holding off for now given pain seems controlled with meds and she is having small BMs 2. Enteritis - 1. Infection is favored over new onset Crohn's in this  71 yo 2. Rocephin / flagyl for empiric treatment 3. Hypokalemia - replacing K, repeat BMP in AM 4. Hypothyroidism - 1. Continue synthroid which looks like it is 50 mcg daily based on a refill note by her PCP in Care [facility].  DVT prophylaxis: Lovenox Code Status: Full Family Communication: Family at bedside Disposition Plan: Home after admit Consults called: None Admission status: Admit to inpatient - inpatient status due to NPO for SBO   GARDNER, JARED M. DO [facility] Pager [phone]  If 7AM-7PM, please contact day team taking care of patient [email or web address] Password [email or web address]  02/16/2017, 6:21 AM

## 2017-02-16 NOTE — Progress Notes (Signed)
Ms Folkes, arrived with carelink from [facility].  Alert and orientedx4.  Sedated but able to amb to the BR with assist.  Abd is distended, hypoactive bowel sounds through out.  Lungs clear.  C/o severe abd pain 9/10. Need sit and stand up for frequently for relief.  On 3 out of 4  dose on K runs. Notified admitting Doc.

## 2017-02-16 NOTE — Plan of Care (Addendum)
Patient with SBO and possible enteritis.  Accepted for transfer to med-surg.

## 2017-02-17 ENCOUNTER — Inpatient Hospital Stay (HOSPITAL_COMMUNITY): Payer: Medicare Other

## 2017-02-17 LAB — CBC
HEMATOCRIT: 45.5 % (ref 36.0–46.0)
HEMATOCRIT: 43.6 % (ref 36.0–46.0)
HEMATOCRIT: 37.6 % (ref 36.0–46.0)
HEMOGLOBIN: 14.8 g/dL (ref 12.0–15.0)
HEMOGLOBIN: 12.6 g/dL (ref 12.0–15.0)
Hemoglobin: 15.6 g/dL — ABNORMAL HIGH (ref 12.0–15.0)
MCH: 31.5 pg (ref 26.0–34.0)
MCH: 31.5 pg (ref 26.0–34.0)
MCH: 30.7 pg (ref 26.0–34.0)
MCHC: 34.3 g/dL (ref 30.0–36.0)
MCHC: 33.9 g/dL (ref 30.0–36.0)
MCHC: 33.5 g/dL (ref 30.0–36.0)
MCV: 91.7 fL (ref 78.0–100.0)
MCV: 92.8 fL (ref 78.0–100.0)
MCV: 91.7 fL (ref 78.0–100.0)
PLATELETS: 248 10*3/uL (ref 150–400)
Platelets: 223 10*3/uL (ref 150–400)
Platelets: 218 10*3/uL (ref 150–400)
RBC: 4.96 MIL/uL (ref 3.87–5.11)
RBC: 4.7 MIL/uL (ref 3.87–5.11)
RBC: 4.1 MIL/uL (ref 3.87–5.11)
RDW: 13.6 % (ref 11.5–15.5)
RDW: 13.7 % (ref 11.5–15.5)
RDW: 14 % (ref 11.5–15.5)
WBC: 22.4 10*3/uL — AB (ref 4.0–10.5)
WBC: 20.7 10*3/uL — ABNORMAL HIGH (ref 4.0–10.5)
WBC: 17.1 10*3/uL — ABNORMAL HIGH (ref 4.0–10.5)

## 2017-02-17 LAB — BASIC METABOLIC PANEL
Anion gap: 6 (ref 5–15)
BUN: 27 mg/dL — ABNORMAL HIGH (ref 6–20)
CHLORIDE: 107 mmol/L (ref 101–111)
CO2: 23 mmol/L (ref 22–32)
Calcium: 7.5 mg/dL — ABNORMAL LOW (ref 8.9–10.3)
Creatinine, Ser: 0.99 mg/dL (ref 0.44–1.00)
GFR calc non Af Amer: 56 mL/min — ABNORMAL LOW (ref >60)
Glucose, Bld: 138 mg/dL — ABNORMAL HIGH (ref 65–99)
POTASSIUM: 4 mmol/L (ref 3.5–5.1)
Sodium: 136 mmol/L (ref 135–145)

## 2017-02-17 LAB — ABO/RH: ABO/RH(D): A POS

## 2017-02-17 LAB — LACTIC ACID, PLASMA: LACTIC ACID, VENOUS: 1.6 mmol/L (ref 0.5–1.9)

## 2017-02-17 MED ORDER — GABAPENTIN 600 MG PO TABS
300.0000 mg | ORAL_TABLET | Freq: Every day | ORAL | Status: DC
  Filled 2017-02-17: qty 0.5

## 2017-02-17 MED ORDER — GABAPENTIN 300 MG PO CAPS
300.0000 mg | ORAL_CAPSULE | Freq: Every day | ORAL | Status: DC
  Administered 2017-02-17 – 2017-02-20 (×4): 300 mg via ORAL
  Filled 2017-02-17 (×4): qty 1

## 2017-02-17 NOTE — Progress Notes (Addendum)
PROGRESS NOTE    Cheyenne Cole  JXB:[id number] DOB: 04/10/1946 DOA: 02/15/2017 PCP: Nadara Eaton, MD   Brief Narrative: 71 y.o. female with medical history significant of IBS, colon polyps, uterine cancer stage 1A in 2009 s/p TAH/BSO.  Patient presents to the ED at [facility] with c/o LLQ abd pain.  Symptoms onset at about 8pm, pain is crampy, 8/10 at worst.  2 episodes of dry heaves followed by vomiting.  Very little total emesis.  Tried taking miralax because she thought she was constipated.  Since then has had several small BMs but pain persists.   ED Course: CT abd / pelvis suspicious for SBO in setting of enteritis   Assessment & Plan:   Principal Problem:   SBO (small bowel obstruction) (HCC) Active Problems:   Enteritis presumed infectious   Hypokalemia 1. SBO - likely PSBO in setting of enteritis 1. Soft diet.follow up kub. 2. IVF: NS at 125 cc/hr 3. Zofran and dilaudid PRN 4. NGT if symptoms worsen, but holding off for now given pain seems controlled with meds and she is having small BMs 2. Enteritis - 1. Infection is favored over new onset Crohn's in this 71 yo 2. Rocephin / flagyl for empiric treatment Hypokalemia - replacing K, repeat normal hypothyroidsm continue synthroid.    DVT prophylaxis: lovenox Code Status: full Family Communication:none Disposition Plan tbd   Consultants:  Procedures:    Antimicrobials: rocephin and flagyl   Subjective:feels better   Objective: Vitals:   02/16/17 1731 02/16/17 2134 02/17/17 0548 02/17/17 1401  BP:  (!) 150/93 138/73 (!) 150/83  Pulse:  (!) 110 (!) 102 89  Resp:  16  14  Temp:  98.5 F (36.9 C) 97.7 F (36.5 C) 98.9 F (37.2 C)  TempSrc:  Oral Axillary Oral  SpO2:  98% 97% 99%  Weight: 56.7 kg (125 lb)     Height:  (1.549 m)       Intake/Output Summary (Last 24 hours) at 02/17/17 1540 Last data filed at 02/17/17 1401  Gross per 24 hour  Intake             2745 ml  Output                0  ml  Net             2745 ml   Filed Weights   02/16/17 1731  Weight: 56.7 kg (125 lb)    Examination:  General exam: Appears calm and comfortable  Respiratory system: Clear to auscultation. Respiratory effort normal. Cardiovascular system: S1 & S2 heard, RRR. No JVD, murmurs, rubs, gallops or clicks. No pedal edema. Gastrointestinal system: Abdomen is nondistended, soft and  mildtender. No organomegaly or masses felt. Normal bowel sounds heard. Central nervous system: Alert and oriented. No focal neurological deficits. Extremities: Symmetric 5 x 5 power. Skin: No rashes, lesions or ulcers Psychiatry: Judgement and insight appear normal. Mood & affect appropriate.     Data Reviewed: I have personally reviewed following labs and imaging studies  CBC:  Recent Labs Lab 02/16/17 0002 02/16/17 2042 02/17/17 0157 02/17/17 0540  WBC 8.7 22.3* 22.4* 20.7*  NEUTROABS 6.9  --   --   --   HGB 13.7 15.6* 15.6* 14.8  HCT 40.3 44.7 45.5 43.6  MCV 92.0 91.0 91.7 92.8  PLT 249 226 248 223   Basic Metabolic Panel:  Recent Labs Lab 02/16/17 0002 02/17/17 0540  NA 132* 136  K 2.8* 4.0  CL  94* 107  CO2 28 23  GLUCOSE 150* 138*  BUN 17 27*  CREATININE 0.52 0.99  CALCIUM 9.1 7.5*   GFR: Estimated Creatinine Clearance: 39.9 mL/min (by C-G formula based on SCr of 0.99 mg/dL). Liver Function Tests:  Recent Labs Lab 02/16/17 0002  AST 27  ALT 18  ALKPHOS 72  BILITOT 0.3  PROT 7.6  ALBUMIN 4.4   No results for input(s): LIPASE, AMYLASE in the last 168 hours. No results for input(s): AMMONIA in the last 168 hours. Coagulation Profile: No results for input(s): INR, PROTIME in the last 168 hours. Cardiac Enzymes: No results for input(s): CKTOTAL, CKMB, CKMBINDEX, TROPONINI in the last 168 hours. BNP (last 3 results) No results for input(s): PROBNP in the last 8760 hours. HbA1C: No results for input(s): HGBA1C in the last 72 hours. CBG: No results for input(s): GLUCAP in  the last 168 hours. Lipid Profile: No results for input(s): CHOL, HDL, LDLCALC, TRIG, CHOLHDL, LDLDIRECT in the last 72 hours. Thyroid Function Tests: No results for input(s): TSH, T4TOTAL, FREET4, T3FREE, THYROIDAB in the last 72 hours. Anemia Panel: No results for input(s): VITAMINB12, FOLATE, FERRITIN, TIBC, IRON, RETICCTPCT in the last 72 hours. Sepsis Labs:  Recent Labs Lab 02/16/17 2042 02/17/17 0157  LATICACIDVEN 2.0* 1.6    No results found for this or any previous visit (from the past 240 hour(s)).       Radiology Studies: Dg Abd 1 View  Result Date: 02/17/2017 CLINICAL DATA:  71 year old female with abdominal pain and nausea. Small bowel obstruction on CT Abdomen and Pelvis yesterday. EXAM: ABDOMEN - 1 VIEW COMPARISON:  CT Abdomen and Pelvis 02/16/2017. FINDINGS: Stable to mildly increased dilatation of gas-filled small bowel in the central lower abdomen compared to the CT yesterday (up to 3 cm diameter). Paucity of other small bowel gas may indicate continued fluid-filled loops. Continued paucity of large bowel gas. Surgical clips along the pelvic sidewalls and common iliac vessels. Possible increased atelectasis at both lung bases. Dextroconvex lumbar scoliosis. No acute osseous abnormality identified. IMPRESSION: 1. Continued small bowel obstruction, suspect not significantly changed since the CT Abdomen and Pelvis yesterday. 2. Lung base atelectasis. Electronically Signed   By: Odessa Fleming M.D.   On: 02/17/2017 10:46   Ct Abdomen Pelvis W Contrast  Result Date: 02/16/2017 CLINICAL DATA:  Nausea, vomiting, and diarrhea. Bloating. Prior colonoscopy with polyp removal. History of uterine cancer. Hematuria. EXAM: CT ABDOMEN AND PELVIS WITH CONTRAST TECHNIQUE: Multidetector CT imaging of the abdomen and pelvis was performed using the standard protocol following bolus administration of intravenous contrast. CONTRAST:  ISOVUE-300 IOPAMIDOL (ISOVUE-300) INJECTION 61% COMPARISON:   None. FINDINGS: Lower chest: The lung bases are clear. Hepatobiliary: No focal liver abnormality is seen. No gallstones, gallbladder wall thickening, or biliary dilatation. Pancreas: Unremarkable. No pancreatic ductal dilatation or surrounding inflammatory changes. Spleen: Normal in size without focal abnormality. Adrenals/Urinary Tract: Adrenal glands are unremarkable. Kidneys are normal, without renal calculi, focal lesion, or hydronephrosis. Bladder is unremarkable. Stomach/Bowel: Stomach is not abnormally distended and no wall thickening is appreciated. There is mild a distention of fluid-filled small bowel particularly in the lower abdomen and pelvis. There is mild bowel wall thickening with a prominent thick walled bowel loop in the pelvis. There is decompression of the terminal ileum with transition zone in the pelvis or right lower quadrant. Changes are consistent with small bowel obstruction. Wall thickening suggests superimposed or pre-existing enteritis or inflammatory bowel disease. Can't exclude stricture from Crohn's disease. No pneumatosis  or portal venous gas to suggest ischemia. Colon is mostly decompressed. No inflammatory changes in the colon. Appendix is not identified. Vascular/Lymphatic: Aortic atherosclerosis. No enlarged abdominal or pelvic lymph nodes. Reproductive: Status post hysterectomy. No adnexal masses. Other: No free air in the abdomen. Small amount of free fluid around the liver, possibly indicating ascites. Musculoskeletal: Lumbar scoliosis convex towards the right. No destructive bone lesions. IMPRESSION: 1. Mildly dilated fluid-filled small bowel with distal decompression and small bowel wall thickening. Changes consistent with small bowel obstruction. Coexisting enteritis. Can't exclude stricture due to Crohn's disease. Transition zone in the right lower quadrant pelvis. 2. No evidence of diverticulitis. 3. Aortic atherosclerosis. 4. Small amount of ascites around the liver.  Electronically Signed   By: Burman Nieves M.D.   On: 02/16/2017 02:16        Scheduled Meds: . cycloSPORINE  1 drop Both Eyes BID  . dorzolamide-timolol  1 drop Both Eyes BID  . levothyroxine  50 mcg Oral QAC breakfast   Continuous Infusions: . sodium chloride 125 mL/hr at 02/17/17 1003  . cefTRIAXone (ROCEPHIN)  IV Stopped (02/17/17 1191)  . metronidazole Stopped (02/17/17 1453)     LOS: 1 day       Alwyn Ren, MD [facility]  If 7PM-7AM, please contact night-coverage [email or web address] Password [email or web address] 02/17/2017, 3:40 PM

## 2017-02-18 ENCOUNTER — Inpatient Hospital Stay (HOSPITAL_COMMUNITY): Payer: Medicare Other

## 2017-02-18 LAB — CBC
HCT: 33 % — ABNORMAL LOW (ref 36.0–46.0)
Hemoglobin: 11.1 g/dL — ABNORMAL LOW (ref 12.0–15.0)
MCH: 30.9 pg (ref 26.0–34.0)
MCHC: 33.6 g/dL (ref 30.0–36.0)
MCV: 91.9 fL (ref 78.0–100.0)
PLATELETS: 211 10*3/uL (ref 150–400)
RBC: 3.59 MIL/uL — AB (ref 3.87–5.11)
RDW: 14 % (ref 11.5–15.5)
WBC: 13.9 10*3/uL — AB (ref 4.0–10.5)

## 2017-02-18 LAB — BASIC METABOLIC PANEL
ANION GAP: 5 (ref 5–15)
BUN: 24 mg/dL — AB (ref 6–20)
CO2: 21 mmol/L — ABNORMAL LOW (ref 22–32)
Calcium: 7.2 mg/dL — ABNORMAL LOW (ref 8.9–10.3)
Chloride: 113 mmol/L — ABNORMAL HIGH (ref 101–111)
Creatinine, Ser: 0.6 mg/dL (ref 0.44–1.00)
GFR calc Af Amer: >60 mL/min (ref >60)
Glucose, Bld: 105 mg/dL — ABNORMAL HIGH (ref 65–99)
POTASSIUM: 3.6 mmol/L (ref 3.5–5.1)
SODIUM: 139 mmol/L (ref 135–145)

## 2017-02-18 MED ORDER — DICYCLOMINE HCL 10 MG PO CAPS
10.0000 mg | ORAL_CAPSULE | Freq: Four times a day (QID) | ORAL | Status: DC | PRN
  Administered 2017-02-18 – 2017-02-21 (×2): 10 mg via ORAL
  Filled 2017-02-18 (×3): qty 1

## 2017-02-18 MED ORDER — BUSPIRONE HCL 5 MG PO TABS: 5.0000 mg | ORAL_TABLET | Freq: Three times a day (TID) | ORAL | Status: DC

## 2017-02-18 MED ORDER — ESCITALOPRAM OXALATE 20 MG PO TABS
20.0000 mg | ORAL_TABLET | Freq: Every day | ORAL | Status: DC
  Administered 2017-02-18 – 2017-02-24 (×7): 20 mg via ORAL
  Filled 2017-02-18 (×7): qty 1

## 2017-02-18 MED ORDER — FLUTICASONE PROPIONATE 50 MCG/ACT NA SUSP
1.0000 | Freq: Every day | NASAL | Status: DC
  Filled 2017-02-18: qty 16

## 2017-02-18 MED ORDER — BUSPIRONE HCL 10 MG PO TABS
10.0000 mg | ORAL_TABLET | Freq: Two times a day (BID) | ORAL | Status: DC
  Administered 2017-02-18 – 2017-02-24 (×8): 10 mg via ORAL
  Filled 2017-02-18 (×9): qty 1

## 2017-02-18 MED ORDER — BUSPIRONE HCL 5 MG PO TABS
5.0000 mg | ORAL_TABLET | Freq: Every day | ORAL | Status: DC
  Administered 2017-02-18 – 2017-02-23 (×4): 5 mg via ORAL
  Filled 2017-02-18 (×5): qty 1

## 2017-02-18 NOTE — Progress Notes (Signed)
PROGRESS NOTE    Cheyenne Cole  ONG:[id number] DOB: 01-25-1946 DOA: 02/15/2017 PCP: Nadara Eaton, MD   71 y.o.femalewith medical history significant of IBS, colon polyps, uterine cancer stage 1A in 2009 s/p TAH/BSO. Patient presents to the ED at [facility] with c/o LLQ abd pain. Symptoms onset at about 8pm, pain is crampy, 8/10 at worst. 2 episodes of dry heaves followed by vomiting. Very little total emesis. Tried taking miralax because she thought she was constipated. Since then has had several small BMs but pain persists.   ED Course:CT abd / pelvis suspicious for SBO in setting of enteritis Patient had bms yesterday.kub done today shows sbo still..   Assessment & Plan:   Principal Problem:   SBO (small bowel obstruction) (HCC) Active Problems:   Enteritis presumed infectious   Hypokalemia SBO/ENTERITIS-SHE HAS HAD MULTIPLE ABDOMINAL SURGERIES.WILL CONTINUE CLEARS AND ROCEPHIN AND FLAGYL AND RECHECK KUB IN AM.PT EVAL.IF NO IMPROVEMENT WILLCONSIDER SURGERY CONSULT.  HYPOTHYROIDM CONTINUE SYNTHROID.     DVT prophylaxis Code Status:  Family Communication: Disposition Plan:    Consultants:     Procedures:   Antimicrobials:    Subjective:   Objective: Vitals:   02/17/17 0548 02/17/17 1401 02/17/17 2135 02/18/17 0533  BP: 138/73 (!) 150/83 (!) 158/81 (!) 148/83  Pulse: (!) 102 89 98 87  Resp:  Temp: 97.7 F (36.5 C) 98.9 F (37.2 C) 98.2 F (36.8 C) 98 F (36.7 C)  TempSrc: Axillary Oral Oral Oral  SpO2: 97% 99% 97% 99%  Weight:      Height:        Intake/Output Summary (Last 24 hours) at 02/18/17 1237 Last data filed at 02/18/17 0954  Gross per 24 hour  Intake          3270.83 ml  Output                0 ml  Net          3270.83 ml   Filed Weights   02/16/17 1731  Weight: 56.7 kg (125 lb)    Examination:  General exam: Appears calm and comfortable  Respiratory system: Clear to auscultation. Respiratory effort  normal. Cardiovascular system: S1 & S2 heard, RRR. No JVD, murmurs, rubs, gallops or clicks. No pedal edema. Gastrointestinal system: Abdomen is distended, soft and tender. No organomegaly or masses felt. DECREASED bowel sounds heard. Central nervous system: Alert and oriented. No focal neurological deficits. Extremities: Symmetric 5 x 5 power. Skin: No rashes, lesions or ulcers Psychiatry: Judgement and insight appear normal. Mood & affect appropriate.     Data Reviewed: I have personally reviewed following labs and imaging studies  CBC:  Recent Labs Lab 02/16/17 0002 02/16/17 2042 02/17/17 0157 02/17/17 0540 02/17/17 2139 02/18/17 0522  WBC 8.7 22.3* 22.4* 20.7* 17.1* 13.9*  NEUTROABS 6.9  --   --   --   --   --   HGB 13.7 15.6* 15.6* 14.8 12.6 11.1*  HCT 40.3 44.7 45.5 43.6 37.6 33.0*  MCV 92.0 91.0 91.7 92.8 91.7 91.9  PLT 249 226 248 223 218 211   Basic Metabolic Panel:  Recent Labs Lab 02/16/17 0002 02/17/17 0540 02/18/17 0522  NA 132* 136 139  K 2.8* 4.0 3.6  CL 94* 107 113*  CO2 28 23 21*  GLUCOSE 150* 138* 105*  BUN 17 27* 24*  CREATININE 0.52 0.99 0.60  CALCIUM 9.1 7.5* 7.2*   GFR: Estimated Creatinine Clearance: 49.4 mL/min (by C-G formula based  on SCr of 0.6 mg/dL). Liver Function Tests:  Recent Labs Lab 02/16/17 0002  AST 27  ALT 18  ALKPHOS 72  BILITOT 0.3  PROT 7.6  ALBUMIN 4.4   No results for input(s): LIPASE, AMYLASE in the last 168 hours. No results for input(s): AMMONIA in the last 168 hours. Coagulation Profile: No results for input(s): INR, PROTIME in the last 168 hours. Cardiac Enzymes: No results for input(s): CKTOTAL, CKMB, CKMBINDEX, TROPONINI in the last 168 hours. BNP (last 3 results) No results for input(s): PROBNP in the last 8760 hours. HbA1C: No results for input(s): HGBA1C in the last 72 hours. CBG: No results for input(s): GLUCAP in the last 168 hours. Lipid Profile: No results for input(s): CHOL, HDL, LDLCALC,  TRIG, CHOLHDL, LDLDIRECT in the last 72 hours. Thyroid Function Tests: No results for input(s): TSH, T4TOTAL, FREET4, T3FREE, THYROIDAB in the last 72 hours. Anemia Panel: No results for input(s): VITAMINB12, FOLATE, FERRITIN, TIBC, IRON, RETICCTPCT in the last 72 hours. Sepsis Labs:  Recent Labs Lab 02/16/17 2042 02/17/17 0157  LATICACIDVEN 2.0* 1.6    No results found for this or any previous visit (from the past 240 hour(s)).       Radiology Studies: Dg Abd 1 View  Result Date: 02/17/2017 CLINICAL DATA:  70 year old female with abdominal pain and nausea. Small bowel obstruction on CT Abdomen and Pelvis yesterday. EXAM: ABDOMEN - 1 VIEW COMPARISON:  CT Abdomen and Pelvis 02/16/2017. FINDINGS: Stable to mildly increased dilatation of gas-filled small bowel in the central lower abdomen compared to the CT yesterday (up to 3 cm diameter). Paucity of other small bowel gas may indicate continued fluid-filled loops. Continued paucity of large bowel gas. Surgical clips along the pelvic sidewalls and common iliac vessels. Possible increased atelectasis at both lung bases. Dextroconvex lumbar scoliosis. No acute osseous abnormality identified. IMPRESSION: 1. Continued small bowel obstruction, suspect not significantly changed since the CT Abdomen and Pelvis yesterday. 2. Lung base atelectasis. Electronically Signed   By: Odessa Fleming M.D.   On: 02/17/2017 10:46        Scheduled Meds: . cycloSPORINE  1 drop Both Eyes BID  . dorzolamide-timolol  1 drop Both Eyes BID  . gabapentin  300 mg Oral QHS  . levothyroxine  50 mcg Oral QAC breakfast   Continuous Infusions: . sodium chloride 125 mL/hr at 02/18/17 0609  . cefTRIAXone (ROCEPHIN)  IV Stopped (02/18/17 0730)  . metronidazole Stopped (02/18/17 0700)     LOS: 2 days     Alwyn Ren, MD [facility] Pager [phone]  If 7PM-7AM, please contact night-coverage [email or web address] Password [email or web address] 02/18/2017, 12:37 PM

## 2017-02-18 NOTE — Evaluation (Signed)
Physical Therapy Evaluation Patient Details Name: Cheyenne Cole MRN: [id number] DOB: January 22, 1946 Today's Date: 02/18/2017   History of Present Illness  71 yo female admitted with SBO. hx of sciatica, scoliosis  Clinical Impression  On eval, pt required Min assist for mobility. She walked ~250 feet with 1 HHA. LOB x 2. Encouraged pt to walk with nursing as able. Encouraged pt to consider using rollator for ambulation safety. Will follow during hospital stay.     Follow Up Recommendations Home health PT;Supervision - Intermittent (pt is open to rehab at [facility] if she does not progress well)    Equipment Recommendations  None recommended by PT    Recommendations for Other Services       Precautions / Restrictions Precautions Precautions: Fall Restrictions Weight Bearing Restrictions: No      Mobility  Bed Mobility Overal bed mobility: Modified Independent                Transfers Overall transfer level: Modified independent                  Ambulation/Gait Ambulation/Gait assistance: Min assist Ambulation Distance (Feet): 250 Feet Assistive device: 1 person hand held assist Gait Pattern/deviations: Step-through pattern;Decreased stride length;Staggering left;Staggering right     General Gait Details: Unsteady at times requiring small amount of assist to stabilize. Mild LOB x 2. 1 seated rest break  Stairs            Wheelchair Mobility    Modified Rankin (Stroke Patients Only)       Balance Overall balance assessment: Needs assistance           Standing balance-Leahy Scale: Fair                               Pertinent Vitals/Pain Pain Assessment: 0-10 Pain Score: 5  Pain Location: abdomen Pain Descriptors / Indicators: Sore;Aching Pain Intervention(s): Monitored during session;Repositioned    Home Living Family/patient expects to be discharged to:: Private residence Living Arrangements: Alone (spouse is in SNF)    Type of Home: Independent living facility Hospital doctor) Home Access: Elevator     Home Layout: One level Home Equipment: Environmental consultant - 4 wheels      Prior Function Level of Independence: Independent               Hand Dominance        Extremity/Trunk Assessment   Upper Extremity Assessment Upper Extremity Assessment: Overall WFL for tasks assessed    Lower Extremity Assessment Lower Extremity Assessment: Generalized weakness    Cervical / Trunk Assessment Cervical / Trunk Assessment: Normal  Communication   Communication: No difficulties  Cognition Arousal/Alertness: Awake/alert Behavior During Therapy: WFL for tasks assessed/performed Overall Cognitive Status: Within Functional Limits for tasks assessed                                        General Comments      Exercises     Assessment/Plan    PT Assessment Patient needs continued PT services  PT Problem List Decreased mobility;Decreased strength;Decreased activity tolerance;Decreased balance;Decreased knowledge of use of DME;Pain       PT Treatment Interventions DME instruction;Therapeutic activities;Therapeutic exercise;Gait training;Patient/family education;Balance training;Functional mobility training    PT Goals (Current goals can be found in the Care Plan section)  Acute Rehab PT Goals Patient  Stated Goal: regain PLOF. less pain PT Goal Formulation: With patient Time For Goal Achievement: 03/04/17 Potential to Achieve Goals: Good    Frequency Min 3X/week   Barriers to discharge Decreased caregiver support      Co-evaluation               AM-PAC PT "6 Clicks" Daily Activity  Outcome Measure Difficulty turning over in bed (including adjusting bedclothes, sheets and blankets)?: None Difficulty moving from lying on back to sitting on the side of the bed? : None Difficulty sitting down on and standing up from a chair with arms (e.g., wheelchair, bedside commode, etc,.)?:  None Help needed moving to and from a bed to chair (including a wheelchair)?: None Help needed walking in hospital room?: A Little Help needed climbing 3-5 steps with a railing? : A Little 6 Click Score: 22    End of Session   Activity Tolerance: Patient limited by fatigue;Patient limited by pain Patient left: in bed;with call bell/phone within reach   PT Visit Diagnosis: Muscle weakness (generalized) (M62.81);Difficulty in walking, not elsewhere classified (R26.2);Pain Pain - part of body:  (abdomen)    Time: 1610-9604 PT Time Calculation (min) (ACUTE ONLY): 17 min   Charges:   PT Evaluation $PT Eval Low Complexity: 1 Low     PT G Codes:          Rebeca Alert, MPT Pager: [phone]

## 2017-02-19 ENCOUNTER — Inpatient Hospital Stay (HOSPITAL_COMMUNITY): Payer: Medicare Other

## 2017-02-19 ENCOUNTER — Encounter (HOSPITAL_COMMUNITY): Payer: Self-pay

## 2017-02-19 MED ORDER — PANTOPRAZOLE SODIUM 40 MG PO TBEC
40.0000 mg | DELAYED_RELEASE_TABLET | Freq: Every day | ORAL | Status: DC
  Administered 2017-02-19 – 2017-02-20 (×2): 40 mg via ORAL
  Filled 2017-02-19 (×3): qty 1

## 2017-02-19 NOTE — Progress Notes (Signed)
PROGRESS NOTE    Cheyenne Cole  IHK:[id number] DOB: 30-Dec-1945 DOA: 02/15/2017 PCP: Nadara Eaton, MD   Brief Narrative:  71 y.o.femalewith medical history significant of IBS, colon polyps, uterine cancer stage 1A in 2009 s/p TAH/BSO. Patient presents to the ED at [facility] with c/o LLQ abd pain. Symptoms onset at about 8pm, pain is crampy, 8/10 at worst. 2 episodes of dry heaves followed by vomiting. Very little total emesis. Tried taking miralax because she thought she was constipated. Since then has had several small BMs,pain better.had flatus and bms today.   Assessment & Plan:   Principal Problem:   SBO (small bowel obstruction) (HCC) Active Problems:   Enteritis presumed infectious   Hypokalemia  SBO/ENTERITIS-SHE HAS HAD MULTIPLE ABDOMINAL SURGERIES.WILL CONTINUE CLEARS AND ROCEPHIN AND FLAGYL kub today stable.advance diet to soft diet.kub in am.  HYPOTHYROIDM CONTINUE SYNTHROID.    DVT prophylaxis:lovenox Code Status: full Family Communication none Disposition Plan:  tbd Consultants: none  Procedures: none  Antimicrobials:  Rocephin and flagyl  Subjective: Had bmsand flatus  Objective: Vitals:   02/18/17 1400 02/18/17 2050 02/19/17 0158 02/19/17 0528  BP: (!) 169/92 (!) 180/79 (!) 124/107 (!) 143/67  Pulse: 84 87 (!) 111 80  Resp: Temp: 98.2 F (36.8 C) 98.7 F (37.1 C) 98.9 F (37.2 C) 98.3 F (36.8 C)  TempSrc: Oral Oral Oral Oral  SpO2: 98% 99% 100% 93%  Weight:      Height:        Intake/Output Summary (Last 24 hours) at 02/19/17 1151 Last data filed at 02/19/17 1004  Gross per 24 hour  Intake          2589.17 ml  Output                1 ml  Net          2588.17 ml   Filed Weights   02/16/17 1731  Weight: 56.7 kg (125 lb)    Examination:  General exam: Appears calm and comfortable  Respiratory system: Clear to auscultation. Respiratory effort normal. Cardiovascular system: S1 & S2 heard, RRR. No JVD, murmurs,  rubs, gallops or clicks. No pedal edema. Gastrointestinal system: Abdomen is nondistended, soft and nontender. No organomegaly or masses felt. Normal bowel sounds heard. Central nervous system: Alert and oriented. No focal neurological deficits. Extremities: Symmetric 5 x 5 power. Skin: No rashes, lesions or ulcers Psychiatry: Judgement and insight appear normal. Mood & affect appropriate.     Data Reviewed: I have personally reviewed following labs and imaging studies  CBC:  Recent Labs Lab 02/16/17 0002 02/16/17 2042 02/17/17 0157 02/17/17 0540 02/17/17 2139 02/18/17 0522  WBC 8.7 22.3* 22.4* 20.7* 17.1* 13.9*  NEUTROABS 6.9  --   --   --   --   --   HGB 13.7 15.6* 15.6* 14.8 12.6 11.1*  HCT 40.3 44.7 45.5 43.6 37.6 33.0*  MCV 92.0 91.0 91.7 92.8 91.7 91.9  PLT 249 226 248 223 218 211   Basic Metabolic Panel:  Recent Labs Lab 02/16/17 0002 02/17/17 0540 02/18/17 0522  NA 132* 136 139  K 2.8* 4.0 3.6  CL 94* 107 113*  CO2 28 23 21*  GLUCOSE 150* 138* 105*  BUN 17 27* 24*  CREATININE 0.52 0.99 0.60  CALCIUM 9.1 7.5* 7.2*   GFR: Estimated Creatinine Clearance: 49.4 mL/min (by C-G formula based on SCr of 0.6 mg/dL). Liver Function Tests:  Recent Labs Lab 02/16/17 0002  AST 27  ALT 18  ALKPHOS 72  BILITOT 0.3  PROT 7.6  ALBUMIN 4.4   No results for input(s): LIPASE, AMYLASE in the last 168 hours. No results for input(s): AMMONIA in the last 168 hours. Coagulation Profile: No results for input(s): INR, PROTIME in the last 168 hours. Cardiac Enzymes: No results for input(s): CKTOTAL, CKMB, CKMBINDEX, TROPONINI in the last 168 hours. BNP (last 3 results) No results for input(s): PROBNP in the last 8760 hours. HbA1C: No results for input(s): HGBA1C in the last 72 hours. CBG: No results for input(s): GLUCAP in the last 168 hours. Lipid Profile: No results for input(s): CHOL, HDL, LDLCALC, TRIG, CHOLHDL, LDLDIRECT in the last 72 hours. Thyroid Function  Tests: No results for input(s): TSH, T4TOTAL, FREET4, T3FREE, THYROIDAB in the last 72 hours. Anemia Panel: No results for input(s): VITAMINB12, FOLATE, FERRITIN, TIBC, IRON, RETICCTPCT in the last 72 hours. Sepsis Labs:  Recent Labs Lab 02/16/17 2042 02/17/17 0157  LATICACIDVEN 2.0* 1.6    No results found for this or any previous visit (from the past 240 hour(s)).       Radiology Studies: Dg Abd 1 View  Result Date: 02/19/2017 CLINICAL DATA:  Follow up small bowel obstruction EXAM: ABDOMEN - 1 VIEW COMPARISON:  02/18/2017 FINDINGS: Stable small bowel dilatation is noted centrally within the abdomen. A few relatively normal distal loops of small bowel are noted. No free air is seen. Scoliosis is again noted. IMPRESSION: Stable small bowel obstructive change. Electronically Signed   By: Alcide Clever M.D.   On: 02/19/2017 09:43   Dg Abd 1 View  Result Date: 02/18/2017 CLINICAL DATA:  Abdominal pain and nausea EXAM: ABDOMEN - 1 VIEW COMPARISON:  Abdominal radiograph of October 18, 2016 FINDINGS: There are loops of moderately distended gas-filled small bowel in the mid and lower abdomen. The number of distended small bowel loops has increased. No distended colon is observed. No free extraluminal gas collections are demonstrated. IMPRESSION: Findings consistent with a distal small bowel obstruction. The degree of distention and number of involved small bowel loops has increased since yesterday. Electronically Signed   By: David  [place] M.D.   On: 02/18/2017 13:31        Scheduled Meds: . busPIRone  10 mg Oral BID WC   And  . busPIRone  5 mg Oral QHS  . cycloSPORINE  1 drop Both Eyes BID  . dorzolamide-timolol  1 drop Both Eyes BID  . escitalopram  20 mg Oral Cole  . fluticasone  1 spray Each Nare Cole  . gabapentin  300 mg Oral QHS  . levothyroxine  50 mcg Oral QAC breakfast  . pantoprazole  40 mg Oral Cole   Continuous Infusions: . sodium chloride 125 mL/hr at 02/19/17 0544    . cefTRIAXone (ROCEPHIN)  IV Stopped (02/19/17 2956)  . metronidazole 500 mg (02/19/17 0544)     LOS: 3 days     Alwyn Ren, MD [facility]   If 7PM-7AM, please contact night-coverage [email or web address] Password [email or web address] 02/19/2017, 11:51 AM

## 2017-02-19 NOTE — Progress Notes (Signed)
Physical Therapy Treatment Patient Details Name: Cheyenne Cole MRN: [id number] DOB: 1946-01-15 Today's Date: 02/19/2017    History of Present Illness 71 yo female admitted with SBO. hx of sciatica, scoliosis    PT Comments    Pt continues to c/o abd pain and mild lightheadedness. She requested to use RW for ambulation on today. Instructed pt to continue RW use at home as well until she returns to baseline. Recommend continued ambulation in hallway with nursing. Will continue to follow.     Follow Up Recommendations  Home health PT;Supervision - Intermittent (pt states she is open to SNF at [facility] if she doesn't progress well)     Equipment Recommendations  None recommended by PT    Recommendations for Other Services       Precautions / Restrictions Precautions Precautions: Fall Restrictions Weight Bearing Restrictions: No    Mobility  Bed Mobility Overal bed mobility: Modified Independent                Transfers Overall transfer level: Modified independent                  Ambulation/Gait Ambulation/Gait assistance: Min guard Ambulation Distance (Feet): 200 Feet Assistive device: Rolling walker (2 wheeled) Gait Pattern/deviations: Step-through pattern;Decreased stride length     General Gait Details: close guard for safety with use of RW. Pt prefers to use RW for safety/stability. She walked ~15 feet in room while pushing IV pole.    Stairs            Wheelchair Mobility    Modified Rankin (Stroke Patients Only)       Balance                                            Cognition Arousal/Alertness: Awake/alert Behavior During Therapy: WFL for tasks assessed/performed Overall Cognitive Status: Within Functional Limits for tasks assessed                                        Exercises      General Comments        Pertinent Vitals/Pain Pain Assessment: No/denies pain Pain Score: 5  Pain  Location: abdomen Pain Descriptors / Indicators: Aching;Sore Pain Intervention(s): Monitored during session    Home Living                      Prior Function            PT Goals (current goals can now be found in the care plan section) Progress towards PT goals: Progressing toward goals    Frequency    Min 3X/week      PT Plan Current plan remains appropriate    Co-evaluation              AM-PAC PT "6 Clicks" Daily Activity  Outcome Measure  Difficulty turning over in bed (including adjusting bedclothes, sheets and blankets)?: None Difficulty moving from lying on back to sitting on the side of the bed? : None Difficulty sitting down on and standing up from a chair with arms (e.g., wheelchair, bedside commode, etc,.)?: None Help needed moving to and from a bed to chair (including a wheelchair)?: None Help needed walking in hospital room?: A Little Help needed climbing 3-5  steps with a railing? : A Little 6 Click Score: 22    End of Session   Activity Tolerance: Patient tolerated treatment well;Patient limited by pain Patient left: in bed;with call bell/phone within reach   PT Visit Diagnosis: Muscle weakness (generalized) (M62.81);Difficulty in walking, not elsewhere classified (R26.2);Pain     Time: 1610-9604 PT Time Calculation (min) (ACUTE ONLY): 15 min  Charges:  $Gait Training: 8-22 mins                    G Codes:          Rebeca Alert, MPT Pager: [phone]

## 2017-02-19 NOTE — Plan of Care (Signed)
Problem: Pain Managment: Goal: General experience of comfort will improve Outcome: Progressing Medicated twice for pain with moderate relief

## 2017-02-19 NOTE — Care Management Important Message (Signed)
Important Message  Patient Details  Name: Cheyenne Cole MRN: [id number] Date of Birth: 10/01/1945   Medicare Important Message Given:  Yes    [name] 02/19/2017, 10:49 AMImportant Message  Patient Details  Name: Cheyenne Cole MRN: [id number] Date of Birth: [birth year]   Medicare Important Message Given:  Yes    [name] 02/19/2017, 10:49 AM

## 2017-02-20 ENCOUNTER — Inpatient Hospital Stay (HOSPITAL_COMMUNITY): Payer: Medicare Other

## 2017-02-20 DIAGNOSIS — K56609 Unspecified intestinal obstruction, unspecified as to partial versus complete obstruction: Secondary | ICD-10-CM

## 2017-02-20 DIAGNOSIS — K529 Noninfective gastroenteritis and colitis, unspecified: Secondary | ICD-10-CM

## 2017-02-20 MED ORDER — DIATRIZOATE MEGLUMINE & SODIUM 66-10 % PO SOLN
90.0000 mL | Freq: Once | ORAL | Status: AC
  Administered 2017-02-20: 90 mL via ORAL
  Filled 2017-02-20: qty 90

## 2017-02-20 MED ORDER — KCL IN DEXTROSE-NACL 10-5-0.45 MEQ/L-%-% IV SOLN
INTRAVENOUS | Status: DC
  Administered 2017-02-20: 19:00:00 via INTRAVENOUS
  Filled 2017-02-20 (×3): qty 1000

## 2017-02-20 NOTE — Consult Note (Signed)
Reason for Consult:  bloating, abdominal pain, nausea, films concerning for SBO Referring Physician: Levonne Cole is an 71 y.o. female.  HPI:  Pt is a 71 yo F who presented to the hospital Monday night (10/1) and was admitted 10/2 for around 12-24 hours of crampy abdominal pain, bloating, and severe nausea.  She has never had symptoms like this before.  She denies fever/chills.  She has no sick contacts of which she is aware.  She has had prior TAH/BSO/LN dissection for uterine cancer in 2009.  CT was concerning for a thickened loop of bowel.  She continues to have diarrhea, but no flatus.  She also has remained bloated.  She cannot eat or drink more than a few bites without severe nausea.    Past Medical History:  Diagnosis Date  . Anxiety   . Arthritis   . Endometriosis   . GERD (gastroesophageal reflux disease)   . Hypothyroid   . IBS (irritable bowel syndrome)   . Rosacea   . Sciatica   . SVT (supraventricular tachycardia) (HCC)     Past Surgical History:  Procedure Laterality Date  . ABDOMINAL HYSTERECTOMY    . ADENOIDECTOMY    . APPENDECTOMY    . BREAST BIOPSY    . COLONOSCOPY    . DILATION AND CURETTAGE OF UTERUS    . RIGHT OOPHORECTOMY    . THYROID SURGERY    . TONSILLECTOMY    . UPPER GASTROINTESTINAL ENDOSCOPY    . WISDOM TOOTH EXTRACTION      History reviewed. No pertinent family history.  Social History:  reports that she has never smoked. She has never used smokeless tobacco. She reports that she drinks alcohol. She reports that she does not use drugs.  Allergies:  Allergies  Allergen Reactions  . Ciprofloxacin Swelling    Tongue swelling  . Nsaids Other (See Comments)    Gi upsets; flushes; acid reflux ONLY can take ibuprofen( 1 low dose in one week)  . Prednisone Other (See Comments)     Allergic to steroids Can take low doses; increased flushing Allergic to steroids  . Wellbutrin [Bupropion]     unknown    Medications:  Current Meds   Medication Sig  . acetaminophen (TYLENOL) 500 MG tablet Take 250-500 mg by mouth every 8 (eight) hours as needed for mild pain.  . busPIRone (BUSPAR) 15 MG tablet Take 5-7.5 mg by mouth 3 (three) times daily. Take 7.5mg  by mouth twice daily (breakfast and dinner) and take  at bedtime  . calcium carbonate (TUMS - DOSED IN MG ELEMENTAL CALCIUM) 500 MG chewable tablet Chew 1 tablet by mouth daily as needed for indigestion or heartburn.  . chlorpheniramine (CHLOR-TRIMETON) 4 MG tablet Take 4 mg by mouth every 6 (six) hours as needed for allergies (or vertigo).   . cholecalciferol (VITAMIN D) 400 units TABS tablet Take 400 Units by mouth 2 (two) times daily.  . COSOPT PF 22.3-6.8 MG/ML SOLN ophthalmic solution Place 1 drop into both eyes 2 (two) times daily.  . cycloSPORINE (RESTASIS) 0.05 % ophthalmic emulsion Place 1 drop into both eyes 2 (two) times daily.  Marland Kitchen dextromethorphan-guaiFENesin (TUSSIN DM) 10-100 MG/5ML liquid Take 5-10 mLs by mouth every 4 (four) hours as needed for cough.  . dicyclomine (BENTYL) 10 MG capsule Take 10 mg by mouth daily as needed for spasms.   Marland Kitchen docusate sodium (COLACE) 100 MG capsule Take 100 mg by mouth 2 (two) times daily. Hold for loose stool  .  escitalopram (LEXAPRO) 5 MG tablet Take 5-15 mg by mouth 2 (two) times daily. Take  in the morning and  at dinner  . FIBER FORMULA PO Take 1 capsule by mouth daily.   . fluticasone (FLONASE) 50 MCG/ACT nasal spray Place 2 sprays into the nose daily.  Marland Kitchen gabapentin (NEURONTIN) 100 MG capsule Take 300 mg by mouth at bedtime.   Marland Kitchen guaifenesin (ROBITUSSIN) 100 MG/5ML syrup Take 100 mg by mouth 3 (three) times daily as needed for cough or congestion.   Marland Kitchen ketoconazole (NIZORAL) 2 % cream Apply 1 application topically daily as needed for irritation.   . Latanoprostene Bunod (VYZULTA) 0.024 % SOLN Place 1 drop into both eyes every evening.  Marland Kitchen levothyroxine (SYNTHROID, LEVOTHROID) 50 MCG tablet Take 50 mcg by mouth daily.  .  Melatonin 3 MG TABS Take 1-2 mg by mouth at bedtime as needed (sleep). Take  at bedtime as needed for sleep. May take another  (for  total) if needed.  . MetroNIDAZOLE (METROGEL EX) Apply 1 application topically every morning. Apply to face every morning  . omeprazole (PRILOSEC) 20 MG capsule Take 20 mg by mouth daily.  Marland Kitchen OVER THE COUNTER MEDICATION Take 1 tablet by mouth 2 (two) times daily. OTC calcium combination supplement- (Calcium 900, Magnesium 333, Vit D 400, Zinc 9 per two capsules)  . polyethylene glycol (MIRALAX / GLYCOLAX) packet Take 17 g by mouth daily as needed for mild constipation.   . Polyvinyl Alcohol-Povidone (REFRESH OP) Place 1 drop into both eyes daily as needed (dry eyes).   Marland Kitchen PREVIDENT 5000 SENSITIVE 1.1-5 % PSTE Take 1 application by mouth daily.  . sodium chloride (OCEAN) 0.65 % SOLN nasal spray Place 1 spray into both nostrils as needed (nasal dryness).  . TRIAMCINOLONE ACETONIDE EX Apply 1 application topically daily as needed (rash).  . [DISCONTINUED] Cholecalciferol (VITAMIN D PO) Take 1 tablet by mouth daily.      No results found for this or any previous visit (from the past 48 hour(s)).  Dg Abd 1 View  Result Date: 02/20/2017 CLINICAL DATA:  Abdominal pain.  Followup small bowel obstruction. EXAM: ABDOMEN - 1 VIEW COMPARISON:  02/19/2017 and multiple previous. FINDINGS: Persistent small bowel obstruction pattern. Fluid and air-filled dilated small intestine appears similar. No progressive or new finding. IMPRESSION: Persistent small bowel obstruction pattern. Similar appearance to yesterday. Worsened compared to the presenting studies of 02/16/2017 and 02/17/2017 Electronically Signed   By: Paulina Fusi M.D.   On: 02/20/2017 06:59   Dg Abd 1 View  Result Date: 02/19/2017 CLINICAL DATA:  Follow up small bowel obstruction EXAM: ABDOMEN - 1 VIEW COMPARISON:  02/18/2017 FINDINGS: Stable small bowel dilatation is noted centrally within the abdomen. A few  relatively normal distal loops of small bowel are noted. No free air is seen. Scoliosis is again noted. IMPRESSION: Stable small bowel obstructive change. Electronically Signed   By: Alcide Clever M.D.   On: 02/19/2017 09:43   Dg Abd 1 View  Result Date: 02/18/2017 CLINICAL DATA:  Abdominal pain and nausea EXAM: ABDOMEN - 1 VIEW COMPARISON:  Abdominal radiograph of October 18, 2016 FINDINGS: There are loops of moderately distended gas-filled small bowel in the mid and lower abdomen. The number of distended small bowel loops has increased. No distended colon is observed. No free extraluminal gas collections are demonstrated. IMPRESSION: Findings consistent with a distal small bowel obstruction. The degree of distention and number of involved small bowel loops has increased since yesterday. Electronically Signed  By: David  [place] M.D.   On: 02/18/2017 13:31    Review of Systems  Gastrointestinal: Positive for abdominal pain, constipation, nausea and vomiting.  All other systems reviewed and are negative.  Blood pressure (!) 146/67, pulse 77, temperature 98.6 F (37 C), temperature source Oral, resp. rate 15, height  (1.549 m), weight 56.7 kg (125 lb), SpO2 96 %. Physical Exam  Constitutional: She is oriented to person, place, and time. She appears well-developed and well-nourished. No distress.  HENT:  Head: Normocephalic and atraumatic.  Eyes: Pupils are equal, round, and reactive to light. Conjunctivae are normal. Right eye exhibits no discharge. Left eye exhibits no discharge. No scleral icterus.  Neck: Normal range of motion. Neck supple. No tracheal deviation present.  Cardiovascular: Normal rate and intact distal pulses.   Respiratory: Effort normal. No respiratory distress. She exhibits no tenderness.  GI: Soft. She exhibits distension. She exhibits no mass. There is no tenderness. There is no rebound and no guarding.  Musculoskeletal: Normal range of motion. She exhibits no edema,  tenderness or deformity.  Lymphadenopathy:    She has no cervical adenopathy.  Neurological: She is alert and oriented to person, place, and time. Coordination normal.  Skin: Skin is warm and dry. No rash noted. She is not diaphoretic. No erythema. No pallor.  Psychiatric: She has a normal mood and affect. Her behavior is normal. Judgment and thought content normal.    Assessment/Plan: Ileus secondary to enteritis vs p SBO  Certainly symptoms and films concerning for partial obstruction, and patient has prior surgical history that could go along with partial obstruction.    However, pt continuing to have diarrhea.  This could also reflect an element of inflammatory bowel disease rather than IBS that she has had previously.  Would get SB protocol and back off on diet.    If contrast does not go to colon or patient has no clinical improvement, would consider repeating CT. Would not advance past clears even if SB protocol shows contrast in colon  Will follow.     Adreonna Yontz 02/20/2017, 10:30 AM

## 2017-02-20 NOTE — Progress Notes (Signed)
PROGRESS NOTE  Cheyenne Cole  ZOX:[id number] DOB: 01/20/1946 DOA: 02/15/2017 PCP: Nadara Eaton, MD  Brief Narrative: Cheyenne Cole is a 71 y.o. female with a history of IBS, colon polyps, uterine CA s/p TAH/BSO/LN dissection 2009, remote appendectomy who presented to the ED 10/1 with abdominal pain, nausea, vomiting, and low volume diarrhea. Imaging suggested thickened loops of bowel. Antibiotics for enteritis were started and she was admitted. Abd XR has demonstrated worsening SBO, thought to be partial due to ongoing diarrhea. Surgery was consulted 10/6 and small bowel protocol is underway.   Assessment & Plan: Principal Problem:   SBO (small bowel obstruction) (HCC) Active Problems:   Enteritis presumed infectious   Hypokalemia  SBO due to enteritis: KUB showing worsening pSBO. Pt at risk for requiring surgery due to likely adhesions, symptoms not overall improving at this time.  - Appreciate general surgery recommendations. - Will revert diet to NPO for now, give gastrografin (po if able) and repeat CXR in 8 hrs. Discussed with RN. - Would order CT if contrast not traversed to colon. - IVF's, changed to D5 1/2NS w/10KCl @ 100cc/hr - Ceftriaxone, flagyl. Leukocytosis improving. No cultures.   GERD and IBS: Chronic, stable - PPI, bentyl  Depression, anxiety:  - Continue buspar, lexapro, neurontin qHS  Hypothyroidism: Stable.  - Continue synthroid.   DVT prophylaxis: Lovenox Code Status: Full Family Communication: None at bedside Disposition Plan: Uncertain at this time  Consultants:   General surgery  Procedures:   None  Antimicrobials:  Ceftriaxone, flagyl >>    Subjective: Still very bloated, only able to take one bite of food prior to onset of symptoms. Reflux symptoms exacerbated. Having small BMs without blood, no flatus.   Objective: Vitals:   02/19/17 0158 02/19/17 0528 02/19/17 2136 02/20/17 0604  BP: (!) 124/107 (!) 143/67 (!) 146/83 (!) 146/67    Pulse: (!) 111 80 84 77  Resp: Temp: 98.9 F (37.2 C) 98.3 F (36.8 C) 98.9 F (37.2 C) 98.6 F (37 C)  TempSrc: Oral Oral Oral Oral  SpO2: 100% 93% 95% 96%  Weight:      Height:        Intake/Output Summary (Last 24 hours) at 02/20/17 1701 Last data filed at 02/20/17 1000  Gross per 24 hour  Intake             5265 ml  Output                0 ml  Net             5265 ml   Filed Weights   02/16/17 1731  Weight: 56.7 kg (125 lb)    Gen: 71 y.o. female in no distress  Pulm: Non-labored breathing room air. Clear to auscultation bilaterally.  CV: Regular rate and rhythm. No murmur, rub, or gallop. No JVD, no pedal edema. GI: Abdomen soft, generally tender without rebound or guarding, mildly distended, + bowel sounds. No organomegaly or masses felt. Ext: Warm, no deformities Skin: No rashes, lesions no ulcers Neuro: Alert and oriented. No focal neurological deficits. Psych: Judgement and insight appear normal. Mood & affect appropriate.   Data Reviewed: I have personally reviewed following labs and imaging studies  CBC:  Recent Labs Lab 02/16/17 0002 02/16/17 2042 02/17/17 0157 02/17/17 0540 02/17/17 2139 02/18/17 0522  WBC 8.7 22.3* 22.4* 20.7* 17.1* 13.9*  NEUTROABS 6.9  --   --   --   --   --  HGB 13.7 15.6* 15.6* 14.8 12.6 11.1*  HCT 40.3 44.7 45.5 43.6 37.6 33.0*  MCV 92.0 91.0 91.7 92.8 91.7 91.9  PLT 249 226 248 223 218 211   Basic Metabolic Panel:  Recent Labs Lab 02/16/17 0002 02/17/17 0540 02/18/17 0522  NA 132* 136 139  K 2.8* 4.0 3.6  CL 94* 107 113*  CO2 28 23 21*  GLUCOSE 150* 138* 105*  BUN 17 27* 24*  CREATININE 0.52 0.99 0.60  CALCIUM 9.1 7.5* 7.2*   GFR: Estimated Creatinine Clearance: 49.4 mL/min (by C-G formula based on SCr of 0.6 mg/dL). Liver Function Tests:  Recent Labs Lab 02/16/17 0002  AST 27  ALT 18  ALKPHOS 72  BILITOT 0.3  PROT 7.6  ALBUMIN 4.4   No results for input(s): LIPASE, AMYLASE in the  last 168 hours. No results for input(s): AMMONIA in the last 168 hours. Coagulation Profile: No results for input(s): INR, PROTIME in the last 168 hours. Cardiac Enzymes: No results for input(s): CKTOTAL, CKMB, CKMBINDEX, TROPONINI in the last 168 hours. BNP (last 3 results) No results for input(s): PROBNP in the last 8760 hours. HbA1C: No results for input(s): HGBA1C in the last 72 hours. CBG: No results for input(s): GLUCAP in the last 168 hours. Lipid Profile: No results for input(s): CHOL, HDL, LDLCALC, TRIG, CHOLHDL, LDLDIRECT in the last 72 hours. Thyroid Function Tests: No results for input(s): TSH, T4TOTAL, FREET4, T3FREE, THYROIDAB in the last 72 hours. Anemia Panel: No results for input(s): VITAMINB12, FOLATE, FERRITIN, TIBC, IRON, RETICCTPCT in the last 72 hours. Urine analysis:    Component Value Date/Time   COLORURINE YELLOW 02/16/2017 0035   APPEARANCEUR CLOUDY (A) 02/16/2017 0035   LABSPEC 1.010 02/16/2017 0035   PHURINE 8.5 (H) 02/16/2017 0035   GLUCOSEU NEGATIVE 02/16/2017 0035   HGBUR SMALL (A) 02/16/2017 0035   BILIRUBINUR NEGATIVE 02/16/2017 0035   KETONESUR 15 (A) 02/16/2017 0035   PROTEINUR NEGATIVE 02/16/2017 0035   NITRITE NEGATIVE 02/16/2017 0035   LEUKOCYTESUR SMALL (A) 02/16/2017 0035   No results found for this or any previous visit (from the past 240 hour(s)).    Radiology Studies: Dg Abd 1 View  Result Date: 02/20/2017 CLINICAL DATA:  Abdominal pain.  Followup small bowel obstruction. EXAM: ABDOMEN - 1 VIEW COMPARISON:  02/19/2017 and multiple previous. FINDINGS: Persistent small bowel obstruction pattern. Fluid and air-filled dilated small intestine appears similar. No progressive or new finding. IMPRESSION: Persistent small bowel obstruction pattern. Similar appearance to yesterday. Worsened compared to the presenting studies of 02/16/2017 and 02/17/2017 Electronically Signed   By: Paulina Fusi M.D.   On: 02/20/2017 06:59   Dg Abd 1  View  Result Date: 02/19/2017 CLINICAL DATA:  Follow up small bowel obstruction EXAM: ABDOMEN - 1 VIEW COMPARISON:  02/18/2017 FINDINGS: Stable small bowel dilatation is noted centrally within the abdomen. A few relatively normal distal loops of small bowel are noted. No free air is seen. Scoliosis is again noted. IMPRESSION: Stable small bowel obstructive change. Electronically Signed   By: Alcide Clever M.D.   On: 02/19/2017 09:43    Scheduled Meds: . busPIRone  10 mg Oral BID WC   And  . busPIRone  5 mg Oral QHS  . cycloSPORINE  1 drop Both Eyes BID  . dorzolamide-timolol  1 drop Both Eyes BID  . escitalopram  20 mg Oral Daily  . fluticasone  1 spray Each Nare Daily  . gabapentin  300 mg Oral QHS  . levothyroxine  50 mcg Oral QAC breakfast  . pantoprazole  40 mg Oral Daily   Continuous Infusions: . sodium chloride 125 mL/hr at 02/20/17 1612  . cefTRIAXone (ROCEPHIN)  IV Stopped (02/20/17 0548)  . metronidazole Stopped (02/20/17 1510)     LOS: 4 days   Time spent: 25 minutes.  Hazeline Junker, MD [facility] Pager [phone]  If 7PM-7AM, please contact night-coverage [email or web address] Password [email or web address] 02/20/2017, 5:01 PM

## 2017-02-21 ENCOUNTER — Inpatient Hospital Stay (HOSPITAL_COMMUNITY): Payer: Medicare Other

## 2017-02-21 DIAGNOSIS — E876 Hypokalemia: Secondary | ICD-10-CM

## 2017-02-21 LAB — BASIC METABOLIC PANEL
ANION GAP: 12 (ref 5–15)
BUN: 10 mg/dL (ref 6–20)
CHLORIDE: 100 mmol/L — AB (ref 101–111)
CO2: 26 mmol/L (ref 22–32)
CREATININE: 0.6 mg/dL (ref 0.44–1.00)
Calcium: 7.2 mg/dL — ABNORMAL LOW (ref 8.9–10.3)
GFR calc non Af Amer: >60 mL/min (ref >60)
Glucose, Bld: 113 mg/dL — ABNORMAL HIGH (ref 65–99)
POTASSIUM: 2.3 mmol/L — AB (ref 3.5–5.1)
SODIUM: 138 mmol/L (ref 135–145)

## 2017-02-21 LAB — CBC
HEMATOCRIT: 29.6 % — AB (ref 36.0–46.0)
HEMOGLOBIN: 10.2 g/dL — AB (ref 12.0–15.0)
MCH: 31.5 pg (ref 26.0–34.0)
MCHC: 34.5 g/dL (ref 30.0–36.0)
MCV: 91.4 fL (ref 78.0–100.0)
Platelets: 249 10*3/uL (ref 150–400)
RBC: 3.24 MIL/uL — AB (ref 3.87–5.11)
RDW: 13.9 % (ref 11.5–15.5)
WBC: 9.8 10*3/uL (ref 4.0–10.5)

## 2017-02-21 MED ORDER — PHENOL 1.4 % MT LIQD
1.0000 | OROMUCOSAL | Status: DC | PRN
  Filled 2017-02-21 (×2): qty 177

## 2017-02-21 MED ORDER — GABAPENTIN 300 MG PO CAPS
300.0000 mg | ORAL_CAPSULE | Freq: Every day | ORAL | Status: DC
  Administered 2017-02-22 – 2017-02-23 (×2): 300 mg via ORAL
  Filled 2017-02-21 (×2): qty 1

## 2017-02-21 MED ORDER — MAGNESIUM SULFATE 2 GM/50ML IV SOLN
2.0000 g | Freq: Once | INTRAVENOUS | Status: AC
  Administered 2017-02-21: 2 g via INTRAVENOUS
  Filled 2017-02-21: qty 50

## 2017-02-21 MED ORDER — POTASSIUM CHLORIDE 10 MEQ/100ML IV SOLN
10.0000 meq | INTRAVENOUS | Status: AC
  Administered 2017-02-21 (×6): 10 meq via INTRAVENOUS
  Filled 2017-02-21 (×6): qty 100

## 2017-02-21 MED ORDER — KCL IN DEXTROSE-NACL 40-5-0.45 MEQ/L-%-% IV SOLN
INTRAVENOUS | Status: DC
  Administered 2017-02-21 – 2017-02-22 (×3): via INTRAVENOUS
  Filled 2017-02-21 (×5): qty 1000

## 2017-02-21 MED ORDER — LEVOTHYROXINE SODIUM 100 MCG IV SOLR
25.0000 ug | Freq: Every day | INTRAVENOUS | Status: DC
  Administered 2017-02-22 – 2017-02-23 (×2): 25 ug via INTRAVENOUS
  Filled 2017-02-21 (×2): qty 5

## 2017-02-21 MED ORDER — SALINE SPRAY 0.65 % NA SOLN
1.0000 | NASAL | Status: DC | PRN
  Filled 2017-02-21: qty 44

## 2017-02-21 MED ORDER — PANTOPRAZOLE SODIUM 40 MG IV SOLR
40.0000 mg | Freq: Every day | INTRAVENOUS | Status: DC
  Administered 2017-02-21 – 2017-02-22 (×2): 40 mg via INTRAVENOUS
  Filled 2017-02-21 (×3): qty 40

## 2017-02-21 NOTE — Progress Notes (Signed)
Dr. Fredricka Bonine aware via phone of critical lab Potassium 2.3 recently called to unit. See new orders enter by MD.

## 2017-02-21 NOTE — Progress Notes (Addendum)
Dr Fredricka Bonine aware via phone of xray results post NGT placement. Tube retracted 7 cm per order. Tolerated well. Suction applied at 50 mmHg.

## 2017-02-21 NOTE — Progress Notes (Signed)
PROGRESS NOTE  Cheyenne Cole  ZOX:[id number] DOB: 1946/05/14 DOA: 02/15/2017 PCP: Nadara Eaton, MD  Brief Narrative: Cheyenne Cole is a 71 y.o. female with a history of IBS, colon polyps, uterine CA s/p TAH/BSO/LN dissection 2009, remote appendectomy who presented to the ED 10/1 with abdominal pain, nausea, vomiting, and low volume diarrhea. Imaging suggested thickened loops of bowel. Antibiotics for enteritis were started and she was admitted. Abd XR has demonstrated worsening SBO, thought to be partial due to ongoing diarrhea. Surgery was consulted 10/6 and small bowel protocol demonstrated failure of contrast to reach distal small bowel. NG tube is placed 10/7.     Assessment & Plan: Principal Problem:   SBO (small bowel obstruction) (HCC) Active Problems:   Enteritis presumed infectious   Hypokalemia  SBO due to enteritis: KUB showing worsening pSBO, gastrografin protocol with contrast only reaching stomach and proximal small bowel 10/6. ?free air on initial, not seen on lateral decubitus. Pt at risk for requiring surgery due to likely adhesions, symptoms not overall improving at this time.  - Appreciate general surgery recommendations. - NGT to LIWS. Repeat CT if not improving.  - IVF's, changed to D5 1/2NS w/40KCl @ 75 cc/hr - Ceftriaxone, flagyl. Leukocytosis improving. No blood cultures.  - Check ova & parasites.    Hypokalemia: Severe, due to GI losses.  - Replace by IV and augment KCl in IVF's.   Leg swelling: Symmetric, nonpitting.  - Cut IVF rate to 75cc/hr. Monitor  GERD and IBS: Chronic, stable - PPI IV w/NGT  Depression, anxiety:  - Will resume po meds when able, including buspar, lexapro, neurontin qHS  Hypothyroidism: Stable.  - Continue synthroid, convert to 1/2 dose IV.   DVT prophylaxis: Lovenox Code Status: Full Family Communication: None at bedside Disposition Plan: Uncertain at this time  Consultants:   General surgery  Procedures:    None  Antimicrobials:  Ceftriaxone, flagyl >>    Subjective: Abdominal pain is constant, moderate-severe, improved with dilaudid last night just so she could get some sleep. Trying to minimize this use. Having dribbles of liquid stool. Still severe reflux symptoms and nausea.   Objective: Vitals:   02/19/17 2136 02/20/17 0604 02/20/17 2044 02/21/17 0558  BP: (!) 146/83 (!) 146/67 (!) 149/80 130/63  Pulse: 84 77 76 78  Resp: Temp: 98.9 F (37.2 C) 98.6 F (37 C) 97.9 F (36.6 C) 97.8 F (36.6 C)  TempSrc: Oral Oral Oral Oral  SpO2: 95% 96% 99% 94%  Weight:      Height:        Intake/Output Summary (Last 24 hours) at 02/21/17 1145 Last data filed at 02/21/17 0400  Gross per 24 hour  Intake              950 ml  Output                0 ml  Net              950 ml   Filed Weights   02/16/17 1731  Weight: 56.7 kg (125 lb)    Gen: 71 y.o. female in no distress  Pulm: Non-labored breathing room air. Clear to auscultation bilaterally.  CV: Regular rate and rhythm. No murmur, rub, or gallop. No JVD, no pedal edema. GI: Abdomen soft, generally tender with mild guarding without rebound, mildly distended, + bowel sounds. No organomegaly or masses felt. Ext: Warm, no deformities Skin: No rashes, lesions no ulcers Neuro: Alert and  oriented. No focal neurological deficits. Psych: Judgement and insight appear normal. Mood & affect appropriate.   Data Reviewed: I have personally reviewed following labs and imaging studies  CBC:  Recent Labs Lab 02/16/17 0002  02/17/17 0157 02/17/17 0540 02/17/17 2139 02/18/17 0522 02/21/17 1002  WBC 8.7  < > 22.4* 20.7* 17.1* 13.9* 9.8  NEUTROABS 6.9  --   --   --   --   --   --   HGB 13.7  < > 15.6* 14.8 12.6 11.1* 10.2*  HCT 40.3  < > 45.5 43.6 37.6 33.0* 29.6*  MCV 92.0  < > 91.7 92.8 91.7 91.9 91.4  PLT 249  < > 248 223 218 211 249  < > = values in this interval not displayed. Basic Metabolic Panel:  Recent  Labs Lab 02/16/17 0002 02/17/17 0540 02/18/17 0522 02/21/17 1002  NA 132* 136 139 138  K 2.8* 4.0 3.6 2.3*  CL 94* 107 113* 100*  CO2 28 23 21* 26  GLUCOSE 150* 138* 105* 113*  BUN 17 27* 24* 10  CREATININE 0.52 0.99 0.60 0.60  CALCIUM 9.1 7.5* 7.2* 7.2*   GFR: Estimated Creatinine Clearance: 49.4 mL/min (by C-G formula based on SCr of 0.6 mg/dL). Liver Function Tests:  Recent Labs Lab 02/16/17 0002  AST 27  ALT 18  ALKPHOS 72  BILITOT 0.3  PROT 7.6  ALBUMIN 4.4   Urine analysis:    Component Value Date/Time   COLORURINE YELLOW 02/16/2017 0035   APPEARANCEUR CLOUDY (A) 02/16/2017 0035   LABSPEC 1.010 02/16/2017 0035   PHURINE 8.5 (H) 02/16/2017 0035   GLUCOSEU NEGATIVE 02/16/2017 0035   HGBUR SMALL (A) 02/16/2017 0035   BILIRUBINUR NEGATIVE 02/16/2017 0035   KETONESUR 15 (A) 02/16/2017 0035   PROTEINUR NEGATIVE 02/16/2017 0035   NITRITE NEGATIVE 02/16/2017 0035   LEUKOCYTESUR SMALL (A) 02/16/2017 0035   No results found for this or any previous visit (from the past 240 hour(s)).    Radiology Studies: Dg Abd 1 View  Result Date: 02/20/2017 CLINICAL DATA:  Abdominal pain.  Followup small bowel obstruction. EXAM: ABDOMEN - 1 VIEW COMPARISON:  02/19/2017 and multiple previous. FINDINGS: Persistent small bowel obstruction pattern. Fluid and air-filled dilated small intestine appears similar. No progressive or new finding. IMPRESSION: Persistent small bowel obstruction pattern. Similar appearance to yesterday. Worsened compared to the presenting studies of 02/16/2017 and 02/17/2017 Electronically Signed   By: Paulina Fusi M.D.   On: 02/20/2017 06:59   Dg Abd Decub  Result Date: 02/20/2017 CLINICAL DATA:  Question free air on abdominal radiograph from earlier today. Small-bowel obstruction. EXAM: ABDOMEN - 1 VIEW DECUBITUS COMPARISON:  02/20/2017 abdominal radiographs. FINDINGS: Surgical clips are noted in the bilateral pelvis. There are moderate dilated small bowel  loops with air-fluid levels throughout the abdomen, not appreciably changed. Oral contrast is seen layering within the stomach and within several dilated small bowel loops in the left abdomen. The questionable right upper quadrant gas on the abdominal radiograph from earlier today correlates with a left upper quadrant bowel loop with air-fluid level, favor the hepatic flexure of the colon. No evidence of free intraperitoneal air on this view. IMPRESSION: No evidence of free intraperitoneal air. Persistent diffuse small bowel dilatation with air-fluid levels compatible with distal small bowel obstruction. Oral contrast is predominantly located in the stomach and proximal small bowel. Electronically Signed   By: Delbert Phenix M.D.   On: 02/20/2017 21:01   Dg Abd Portable 1v-small Bowel Obstruction Protocol-initial,  8 Hr Delay  Result Date: 02/20/2017 CLINICAL DATA:  Inpatient.  Follow-up small bowel obstruction. EXAM: PORTABLE ABDOMEN - 1 VIEW COMPARISON:  Abdominal radiograph from earlier today. FINDINGS: Oral contrast is present predominantly in the gastric fundus. A small amount of oral contrast is seen in the duodenum. There is prominent diffuse small bowel dilatation throughout the abdomen up to 4.5 cm diameter, not appreciably changed. There is increased focal gas in the right upper quadrant with questionable Rigler's sign along a right paraspinal small bowel loop, cannot exclude new free air. Surgical clips are again noted bilaterally in the pelvis. IMPRESSION: 1. No appreciable change in diffuse prominent small bowel dilatation throughout the abdomen, compatible with persistent distal small bowel obstruction. 2. Increased focal gas in the right upper quadrant with questionable Rigler's sign involving a right paraspinal small bowel loop, cannot exclude new free intraperitoneal air. Recommend correlation with left lateral decubitus abdominal radiograph. These results were called by telephone at the time of  interpretation on 02/20/2017 at 8:11 pm to Dr. Antionette Char, who verbally acknowledged these results. Electronically Signed   By: Delbert Phenix M.D.   On: 02/20/2017 20:18    Scheduled Meds: . busPIRone  10 mg Oral BID WC   And  . busPIRone  5 mg Oral QHS  . cycloSPORINE  1 drop Both Eyes BID  . dorzolamide-timolol  1 drop Both Eyes BID  . escitalopram  20 mg Oral Daily  . fluticasone  1 spray Each Nare Daily  . gabapentin  300 mg Oral QHS  . [START ON 02/22/2017] levothyroxine  25 mcg Intravenous Daily  . pantoprazole (PROTONIX) IV  40 mg Intravenous QHS   Continuous Infusions: . cefTRIAXone (ROCEPHIN)  IV 2 g (02/21/17 0629)  . dextrose 5 % and 0.45 % NaCl with KCl 40 mEq/L    . magnesium sulfate 1 - 4 g bolus IVPB    . metronidazole Stopped (02/21/17 0729)  . potassium chloride 10 mEq (02/21/17 1125)     LOS: 5 days   Time spent: 25 minutes.  Hazeline Junker, MD [facility] Pager [phone]  If 7PM-7AM, please contact night-coverage [email or web address] Password [email or web address] 02/21/2017, 11:45 AM

## 2017-02-21 NOTE — Progress Notes (Signed)
Subjective/Chief Complaint: Pt continues not to improve.  Continues to have some liquid stools, but only "maybe a few bubbles of gas."  Plain films still show distended SB.  Continues to have crampy abdominal pain.    Objective: Vital signs in last 24 hours: Temp:  [97.8 F (36.6 C)-97.9 F (36.6 C)] 97.8 F (36.6 C) (10/07 0558) Pulse Rate:  [76-78] 78 (10/07 0558) Resp:  [16-18] 18 (10/07 0558) BP: (130-149)/(63-80) 130/63 (10/07 0558) SpO2:  [94 %-99 %] 94 % (10/07 0558) Last BM Date: 02/20/17  Intake/Output from previous day: 10/06 0701 - 10/07 0700 In: 5615 [P.O.:290; I.V.:4675; IV Piggyback:650] Out: -  Intake/Output this shift: No intake/output data recorded.  General appearance: alert, cooperative and mild distress Resp: breathing comfortably GI: soft, bloated, non tender. Extremities: +2 edema.    Lab Results:  No results for input(s): WBC, HGB, HCT, PLT in the last 72 hours. BMET No results for input(s): NA, K, CL, CO2, GLUCOSE, BUN, CREATININE, CALCIUM in the last 72 hours. PT/INR No results for input(s): LABPROT, INR in the last 72 hours. ABG No results for input(s): PHART, HCO3 in the last 72 hours.  Invalid input(s): PCO2, PO2  Studies/Results: Dg Abd 1 View  Result Date: 02/20/2017 CLINICAL DATA:  Abdominal pain.  Followup small bowel obstruction. EXAM: ABDOMEN - 1 VIEW COMPARISON:  02/19/2017 and multiple previous. FINDINGS: Persistent small bowel obstruction pattern. Fluid and air-filled dilated small intestine appears similar. No progressive or new finding. IMPRESSION: Persistent small bowel obstruction pattern. Similar appearance to yesterday. Worsened compared to the presenting studies of 02/16/2017 and 02/17/2017 Electronically Signed   By: Paulina Fusi M.D.   On: 02/20/2017 06:59   Dg Abd Decub  Result Date: 02/20/2017 CLINICAL DATA:  Question free air on abdominal radiograph from earlier today. Small-bowel obstruction. EXAM: ABDOMEN - 1 VIEW  DECUBITUS COMPARISON:  02/20/2017 abdominal radiographs. FINDINGS: Surgical clips are noted in the bilateral pelvis. There are moderate dilated small bowel loops with air-fluid levels throughout the abdomen, not appreciably changed. Oral contrast is seen layering within the stomach and within several dilated small bowel loops in the left abdomen. The questionable right upper quadrant gas on the abdominal radiograph from earlier today correlates with a left upper quadrant bowel loop with air-fluid level, favor the hepatic flexure of the colon. No evidence of free intraperitoneal air on this view. IMPRESSION: No evidence of free intraperitoneal air. Persistent diffuse small bowel dilatation with air-fluid levels compatible with distal small bowel obstruction. Oral contrast is predominantly located in the stomach and proximal small bowel. Electronically Signed   By: Delbert Phenix M.D.   On: 02/20/2017 21:01   Dg Abd Portable 1v-small Bowel Obstruction Protocol-initial, 8 Hr Delay  Result Date: 02/20/2017 CLINICAL DATA:  Inpatient.  Follow-up small bowel obstruction. EXAM: PORTABLE ABDOMEN - 1 VIEW COMPARISON:  Abdominal radiograph from earlier today. FINDINGS: Oral contrast is present predominantly in the gastric fundus. A small amount of oral contrast is seen in the duodenum. There is prominent diffuse small bowel dilatation throughout the abdomen up to 4.5 cm diameter, not appreciably changed. There is increased focal gas in the right upper quadrant with questionable Rigler's sign along a right paraspinal small bowel loop, cannot exclude new free air. Surgical clips are again noted bilaterally in the pelvis. IMPRESSION: 1. No appreciable change in diffuse prominent small bowel dilatation throughout the abdomen, compatible with persistent distal small bowel obstruction. 2. Increased focal gas in the right upper quadrant with questionable Rigler's sign  involving a right paraspinal small bowel loop, cannot exclude  new free intraperitoneal air. Recommend correlation with left lateral decubitus abdominal radiograph. These results were called by telephone at the time of interpretation on 02/20/2017 at 8:11 pm to Dr. Antionette Char, who verbally acknowledged these results. Electronically Signed   By: Delbert Phenix M.D.   On: 02/20/2017 20:18    Anti-infectives: Anti-infectives    Start     Dose/Rate Route Frequency Ordered Stop   02/16/17 0600  cefTRIAXone (ROCEPHIN) 2 g in dextrose 5 % 50 mL IVPB - Premix     2 g 100 mL/hr over 30 Minutes Intravenous Every 24 hours 02/16/17 0547     02/16/17 0600  metroNIDAZOLE (FLAGYL) IVPB 500 mg     500 mg 100 mL/hr over 60 Minutes Intravenous Every 8 hours 02/16/17 0547        Assessment/Plan: s/p * No surgery found * Ileus vs obstruction.  Continued nausea, reflux, bloating and crampy pain.    Plan placement of NGT to LIWS.  If no improvement consider repeat CT tomorrow.   If truly a mechanical obstruction, would need a trial of NGT decompression so see if can resolve non operatively. Surgery to follow.   Discussed with patient and her sister via phone.     LOS: 5 days    Zafira Munos 02/21/2017

## 2017-02-22 DIAGNOSIS — Z978 Presence of other specified devices: Secondary | ICD-10-CM

## 2017-02-22 LAB — CBC
HEMATOCRIT: 29.5 % — AB (ref 36.0–46.0)
Hemoglobin: 10 g/dL — ABNORMAL LOW (ref 12.0–15.0)
MCH: 31.2 pg (ref 26.0–34.0)
MCHC: 33.9 g/dL (ref 30.0–36.0)
MCV: 91.9 fL (ref 78.0–100.0)
Platelets: 257 10*3/uL (ref 150–400)
RBC: 3.21 MIL/uL — ABNORMAL LOW (ref 3.87–5.11)
RDW: 14.1 % (ref 11.5–15.5)
WBC: 7.3 10*3/uL (ref 4.0–10.5)

## 2017-02-22 LAB — BASIC METABOLIC PANEL
Anion gap: 11 (ref 5–15)
CO2: 31 mmol/L (ref 22–32)
Calcium: 7 mg/dL — ABNORMAL LOW (ref 8.9–10.3)
Chloride: 96 mmol/L — ABNORMAL LOW (ref 101–111)
Creatinine, Ser: 0.41 mg/dL — ABNORMAL LOW (ref 0.44–1.00)
GFR calc Af Amer: >60 mL/min (ref >60)
GLUCOSE: 120 mg/dL — AB (ref 65–99)
POTASSIUM: 2.8 mmol/L — AB (ref 3.5–5.1)
Sodium: 138 mmol/L (ref 135–145)

## 2017-02-22 LAB — MAGNESIUM: Magnesium: 1.8 mg/dL (ref 1.7–2.4)

## 2017-02-22 MED ORDER — POTASSIUM CHLORIDE 10 MEQ/100ML IV SOLN
10.0000 meq | INTRAVENOUS | Status: AC
  Administered 2017-02-22 (×6): 10 meq via INTRAVENOUS
  Filled 2017-02-22 (×6): qty 100

## 2017-02-22 MED ORDER — TRAZODONE HCL 50 MG PO TABS
50.0000 mg | ORAL_TABLET | Freq: Every day | ORAL | Status: DC
  Administered 2017-02-22: 50 mg via ORAL
  Filled 2017-02-22: qty 1

## 2017-02-22 MED ORDER — PHENOL 1.4 % MT LIQD: 1.0000 | OROMUCOSAL | Status: DC | PRN

## 2017-02-22 NOTE — Care Management Important Message (Signed)
Important Message  Patient Details  Name: Cheyenne Cole MRN: [id number] Date of Birth: 04-01-1946   Medicare Important Message Given:  Yes    [name] 02/22/2017, 10:29 AMImportant Message  Patient Details  Name: Cheyenne Cole MRN: [id number] Date of Birth: 1946/05/16   Medicare Important Message Given:  Yes    [name] 02/22/2017, 10:29 AM

## 2017-02-22 NOTE — Progress Notes (Signed)
PROGRESS NOTE  Cheyenne Cole  ZOX:[id number] DOB: 08/09/1945 DOA: 02/15/2017 PCP: Nadara Eaton, MD  Brief Narrative: Cheyenne Cole is a 71 y.o. female with a history of IBS, colon polyps, uterine CA s/p TAH/BSO/LN dissection 2009, remote appendectomy who presented to the ED 10/1 with abdominal pain, nausea, vomiting, and low volume diarrhea. Imaging suggested thickened loops of bowel. Antibiotics for enteritis were started and she was admitted. Abd XR has demonstrated worsening SBO, thought to be partial due to ongoing diarrhea. Surgery was consulted 10/6 and small bowel protocol demonstrated failure of contrast to reach distal small bowel. NG tube is placed 10/7.     Assessment & Plan: Principal Problem:   SBO (small bowel obstruction) (HCC) Active Problems:   Enteritis presumed infectious   Hypokalemia  SBO due to enteritis: Gastrografin protocol with contrast only reaching stomach and proximal small bowel 10/6 > placed NGT 10/7, confirmatory XR showed contrast in colon. - Appreciate general surgery recommendations: Clamping NG, trial clears. Plan to pull NG if tolerating.  - D5 1/2NS w/40KCl @ 75 cc/hr - Ceftriaxone, flagyl. Leukocytosis resolved. No blood cultures.  - Check ova & parasites.    Hypokalemia: Severe, due to GI losses.  - Replace by IV runs and IVF.   Leg swelling: Symmetric, nonpitting.  - Cut IVF rate to 75cc/hr. Monitor  GERD and IBS: Chronic, stable - PPI IV w/NGT  Depression, anxiety:  - Will resume po meds when able, including buspar, lexapro, neurontin qHS - Will add trazodone low dose qHS for sleep if still able to take some po tonight.  Hypothyroidism: Stable.  - Continue synthroid IV until reliably taking po   DVT prophylaxis: Lovenox Code Status: Full Family Communication: None at bedside Disposition Plan: Pending clinical course.  Consultants:   General surgery  Procedures:   None  Antimicrobials:  Ceftriaxone, flagyl >>     Subjective: Had little NG output, bloating is improving. Still have low volume dark watery stools. Not sleeping well and requests something for this.  Objective: BP (!) 147/81   Pulse 68   Temp 98.3 F (36.8 C) (Oral)   Resp 15   Ht  (1.549 m)   Wt 56.7 kg (125 lb)   SpO2 98%   BMI 23.62 kg/m   Gen: 71 y.o. female in no distress walking around room Nose: NGT in situ w/light green output.  Pulm: Non-labored breathing room air. Clear to auscultation bilaterally.  CV: Regular rate and rhythm. No murmur, rub, or gallop. No JVD, no pedal edema. GI: Abdomen soft, generally tender with mild guarding without rebound, mildly distended, + bowel sounds. No organomegaly or masses felt. Ext: Warm, no deformities Skin: No rashes, lesions no ulcers Neuro: Alert and oriented. No focal neurological deficits. Psych: Judgement and insight appear normal. Mood & affect appropriate.   CBC:  Recent Labs Lab 02/16/17 0002  02/17/17 0540 02/17/17 2139 02/18/17 0522 02/21/17 1002 02/22/17 0723  WBC 8.7  < > 20.7* 17.1* 13.9* 9.8 7.3  NEUTROABS 6.9  --   --   --   --   --   --   HGB 13.7  < > 14.8 12.6 11.1* 10.2* 10.0*  HCT 40.3  < > 43.6 37.6 33.0* 29.6* 29.5*  MCV 92.0  < > 92.8 91.7 91.9 91.4 91.9  PLT 249  < > 223 218 211 249 257  < > = values in this interval not displayed. Basic Metabolic Panel:  Recent Labs Lab 02/16/17 0002 02/17/17 0540 02/18/17  0522 02/21/17 1002 02/22/17 0723  NA 132* 136 139 138 138  K 2.8* 4.0 3.6 2.3* 2.8*  CL 94* 107 113* 100* 96*  CO2 28 23 21* 26 31  GLUCOSE 150* 138* 105* 113* 120*  BUN 17 27* 24* 10 <5*  CREATININE 0.52 0.99 0.60 0.60 0.41*  CALCIUM 9.1 7.5* 7.2* 7.2* 7.0*  MG  --   --   --   --  1.8   GFR: Estimated Creatinine Clearance: 49.4 mL/min (A) (by C-G formula based on SCr of 0.41 mg/dL (L)). Liver Function Tests:  Recent Labs Lab 02/16/17 0002  AST 27  ALT 18  ALKPHOS 72  BILITOT 0.3  PROT 7.6  ALBUMIN 4.4    Radiology Studies: Dg Abd Decub  Result Date: 02/20/2017 CLINICAL DATA:  Question free air on abdominal radiograph from earlier today. Small-bowel obstruction. EXAM: ABDOMEN - 1 VIEW DECUBITUS COMPARISON:  02/20/2017 abdominal radiographs. FINDINGS: Surgical clips are noted in the bilateral pelvis. There are moderate dilated small bowel loops with air-fluid levels throughout the abdomen, not appreciably changed. Oral contrast is seen layering within the stomach and within several dilated small bowel loops in the left abdomen. The questionable right upper quadrant gas on the abdominal radiograph from earlier today correlates with a left upper quadrant bowel loop with air-fluid level, favor the hepatic flexure of the colon. No evidence of free intraperitoneal air on this view. IMPRESSION: No evidence of free intraperitoneal air. Persistent diffuse small bowel dilatation with air-fluid levels compatible with distal small bowel obstruction. Oral contrast is predominantly located in the stomach and proximal small bowel. Electronically Signed   By: Delbert Phenix M.D.   On: 02/20/2017 21:01   Dg Abd Portable 1v  Result Date: 02/21/2017 CLINICAL DATA:  Nasogastric tube placement. EXAM: PORTABLE ABDOMEN - 1 VIEW COMPARISON:  02/20/2017 FINDINGS: Nasogastric tube passes well below the diaphragm. Tip projects in the right upper quadrant either in the distal stomach or first portion of the duodenum. There is contrast in the colon. There are mildly prominent loops small bowel centrally stable from the prior exam. IMPRESSION: Nasogastric tube tip lies in the distal stomach or first portion of the duodenum. Electronically Signed   By: Amie Portland M.D.   On: 02/21/2017 12:39   Dg Abd Portable 1v-small Bowel Obstruction Protocol-initial, 8 Hr Delay  Result Date: 02/20/2017 CLINICAL DATA:  Inpatient.  Follow-up small bowel obstruction. EXAM: PORTABLE ABDOMEN - 1 VIEW COMPARISON:  Abdominal radiograph from earlier  today. FINDINGS: Oral contrast is present predominantly in the gastric fundus. A small amount of oral contrast is seen in the duodenum. There is prominent diffuse small bowel dilatation throughout the abdomen up to 4.5 cm diameter, not appreciably changed. There is increased focal gas in the right upper quadrant with questionable Rigler's sign along a right paraspinal small bowel loop, cannot exclude new free air. Surgical clips are again noted bilaterally in the pelvis. IMPRESSION: 1. No appreciable change in diffuse prominent small bowel dilatation throughout the abdomen, compatible with persistent distal small bowel obstruction. 2. Increased focal gas in the right upper quadrant with questionable Rigler's sign involving a right paraspinal small bowel loop, cannot exclude new free intraperitoneal air. Recommend correlation with left lateral decubitus abdominal radiograph. These results were called by telephone at the time of interpretation on 02/20/2017 at 8:11 pm to Dr. Antionette Char, who verbally acknowledged these results. Electronically Signed   By: Delbert Phenix M.D.   On: 02/20/2017 20:18     LOS:  6 days   Time spent: 25 minutes.  Hazeline Junker, MD [facility] Pager [phone]  If 7PM-7AM, please contact night-coverage [email or web address] Password [email or web address] 02/22/2017, 3:58 PM

## 2017-02-22 NOTE — Progress Notes (Signed)
Physical Therapy Treatment Patient Details Name: Cheyenne Cole MRN: [id number] DOB: [birth year] Today's Date: 02/22/2017    History of Present Illness 71 yo female admitted with SBO. hx of sciatica, scoliosis    PT Comments    Pt able to negotiate self around room with out assist but tends to support self using wall/furniture/door frame. Assisted with amb a greater distance in hallway but required the use of a walker for mild instability.    Follow Up Recommendations  Home health PT;Supervision - Intermittent;SNF (at [facility] pending progress)     Equipment Recommendations  None recommended by PT    Recommendations for Other Services       Precautions / Restrictions      Mobility  Bed Mobility Overal bed mobility: Modified Independent                Transfers Overall transfer level: Modified independent                  Ambulation/Gait Ambulation/Gait assistance: Min guard Ambulation Distance (Feet): 285 Feet Assistive device: Rolling walker (2 wheeled) Gait Pattern/deviations: Step-through pattern;Decreased stride length Gait velocity: decreased   General Gait Details: close guard for safety with use of RW. Pt prefers to use RW for safety/stability.   In room pt tends to hold to the furniture.   Stairs            Wheelchair Mobility    Modified Rankin (Stroke Patients Only)       Balance                                            Cognition                                              Exercises      General Comments        Pertinent Vitals/Pain      Home Living                      Prior Function            PT Goals (current goals can now be found in the care plan section)      Frequency    Min 3X/week      PT Plan Current plan remains appropriate    Co-evaluation              AM-PAC PT "6 Clicks" Daily Activity  Outcome Measure  Difficulty turning over  in bed (including adjusting bedclothes, sheets and blankets)?: None Difficulty moving from lying on back to sitting on the side of the bed? : None Difficulty sitting down on and standing up from a chair with arms (e.g., wheelchair, bedside commode, etc,.)?: None Help needed moving to and from a bed to chair (including a wheelchair)?: None Help needed walking in hospital room?: A Little Help needed climbing 3-5 steps with a railing? : A Little 6 Click Score: 22    End of Session Equipment Utilized During Treatment: Gait belt Activity Tolerance: Patient tolerated treatment well;Patient limited by pain Patient left: in bed;with call bell/phone within reach   PT Visit Diagnosis: Muscle weakness (generalized) (M62.81);Difficulty in walking, not elsewhere classified (R26.2);Pain     Time:  -  14:59 - 15:18    Charges:    1 gt                   G Codes:       {Juliene Kirsh  PTA [facility]  Acute  Rehab Pager      [phone]

## 2017-02-22 NOTE — Progress Notes (Signed)
[facility]/Trauma Progress Note      Assessment/Plan Principal Problem:   SBO (small bowel obstruction) (HCC) Active Problems:   Enteritis presumed infectious   Hypokalemia  Partial SBO likely 2/2 enteritis - pt is having watery stools and flatus - very little NGT output - last xray on 10/07 showed contrast in the colon  FEN: NGT clamped, clears VTE: per medicine ID: Rocephin and Flagyl 10/02>> Follow up: TBD  DISPO: if pt tolerates clamping and clears will pull tube this afternoon and advance diet to fulls    LOS: 6 days    Subjective:  CC: SBO  Pt is having flatus and watery BM's. No nausea. Mild abdominal pain. No new complaints. She had her sister on speaker phone while I was in the room. Pt states she has never had a SBO before.   Objective: Vital signs in last 24 hours: Temp:  [97.8 F (36.6 C)-98.1 F (36.7 C)] 97.8 F (36.6 C) (10/08 0519) Pulse Rate:  [74-83] 83 (10/08 0519) Resp:  [17-18] 17 (10/08 0519) BP: (143-150)/(67-79) 143/79 (10/08 0519) SpO2:  [94 %-96 %] 94 % (10/08 0519) Last BM Date: 02/22/17  Intake/Output from previous day: 10/07 0701 - 10/08 0700 In: 900 [I.V.:600; IV Piggyback:300] Out: 2900 [Urine:2800; Emesis/NG output:100] Intake/Output this shift: Total I/O In: -  Out: 600 [Urine:600]  PE: Gen:  Alert, NAD, pleasant, cooperative Card:  RRR, no M/G/R heard Pulm:  CTA, no W/R/R, effort normal Abd: Soft, not distended, +BS, very mild generalized TTP, no guarding Skin: no rashes noted, warm and dry   Anti-infectives: Anti-infectives    Start     Dose/Rate Route Frequency Ordered Stop   02/16/17 0600  cefTRIAXone (ROCEPHIN) 2 g in dextrose 5 % 50 mL IVPB - Premix     2 g 100 mL/hr over 30 Minutes Intravenous Every 24 hours 02/16/17 0547     02/16/17 0600  metroNIDAZOLE (FLAGYL) IVPB 500 mg     500 mg 100 mL/hr over 60 Minutes Intravenous Every 8 hours 02/16/17 0547        Lab Results:   Recent Labs  02/21/17 1002 02/22/17 0723  WBC 9.8 7.3  HGB 10.2* 10.0*  HCT 29.6* 29.5*  PLT 249 257   BMET  Recent Labs  02/21/17 1002 02/22/17 0723  NA 138 138  K 2.3* 2.8*  CL 100* 96*  CO2 26 31  GLUCOSE 113* 120*  BUN 10 <5*  CREATININE 0.60 0.41*  CALCIUM 7.2* 7.0*   PT/INR No results for input(s): LABPROT, INR in the last 72 hours. CMP     Component Value Date/Time   NA 138 02/22/2017 0723   K 2.8 (L) 02/22/2017 0723   CL 96 (L) 02/22/2017 0723   CO2 31 02/22/2017 0723   GLUCOSE 120 (H) 02/22/2017 0723   BUN <5 (L) 02/22/2017 0723   CREATININE 0.41 (L) 02/22/2017 0723   CALCIUM 7.0 (L) 02/22/2017 0723   PROT 7.6 02/16/2017 0002   ALBUMIN 4.4 02/16/2017 0002   AST 27 02/16/2017 0002   ALT 18 02/16/2017 0002   ALKPHOS 72 02/16/2017 0002   BILITOT 0.3 02/16/2017 0002   GFRNONAA >60 02/22/2017 0723   GFRAA >60 02/22/2017 0723   Lipase  No results found for: LIPASE  Studies/Results: Dg Abd Decub  Result Date: 02/20/2017 CLINICAL DATA:  Question free air on abdominal radiograph from earlier today. Small-bowel obstruction. EXAM: ABDOMEN - 1 VIEW DECUBITUS COMPARISON:  02/20/2017 abdominal radiographs. FINDINGS: Surgical clips are noted in  the bilateral pelvis. There are moderate dilated small bowel loops with air-fluid levels throughout the abdomen, not appreciably changed. Oral contrast is seen layering within the stomach and within several dilated small bowel loops in the left abdomen. The questionable right upper quadrant gas on the abdominal radiograph from earlier today correlates with a left upper quadrant bowel loop with air-fluid level, favor the hepatic flexure of the colon. No evidence of free intraperitoneal air on this view. IMPRESSION: No evidence of free intraperitoneal air. Persistent diffuse small bowel dilatation with air-fluid levels compatible with distal small bowel obstruction. Oral contrast is predominantly located in the stomach and proximal small bowel.  Electronically Signed   By: Delbert Phenix M.D.   On: 02/20/2017 21:01   Dg Abd Portable 1v  Result Date: 02/21/2017 CLINICAL DATA:  Nasogastric tube placement. EXAM: PORTABLE ABDOMEN - 1 VIEW COMPARISON:  02/20/2017 FINDINGS: Nasogastric tube passes well below the diaphragm. Tip projects in the right upper quadrant either in the distal stomach or first portion of the duodenum. There is contrast in the colon. There are mildly prominent loops small bowel centrally stable from the prior exam. IMPRESSION: Nasogastric tube tip lies in the distal stomach or first portion of the duodenum. Electronically Signed   By: Amie Portland M.D.   On: 02/21/2017 12:39   Dg Abd Portable 1v-small Bowel Obstruction Protocol-initial, 8 Hr Delay  Result Date: 02/20/2017 CLINICAL DATA:  Inpatient.  Follow-up small bowel obstruction. EXAM: PORTABLE ABDOMEN - 1 VIEW COMPARISON:  Abdominal radiograph from earlier today. FINDINGS: Oral contrast is present predominantly in the gastric fundus. A small amount of oral contrast is seen in the duodenum. There is prominent diffuse small bowel dilatation throughout the abdomen up to 4.5 cm diameter, not appreciably changed. There is increased focal gas in the right upper quadrant with questionable Rigler's sign along a right paraspinal small bowel loop, cannot exclude new free air. Surgical clips are again noted bilaterally in the pelvis. IMPRESSION: 1. No appreciable change in diffuse prominent small bowel dilatation throughout the abdomen, compatible with persistent distal small bowel obstruction. 2. Increased focal gas in the right upper quadrant with questionable Rigler's sign involving a right paraspinal small bowel loop, cannot exclude new free intraperitoneal air. Recommend correlation with left lateral decubitus abdominal radiograph. These results were called by telephone at the time of interpretation on 02/20/2017 at 8:11 pm to Dr. Antionette Char, who verbally acknowledged these results.  Electronically Signed   By: Delbert Phenix M.D.   On: 02/20/2017 20:18      Jerre Simon , [facility] 02/22/2017, 10:39 AM Pager: [phone] Consults: [phone] Mon-Fri 7:00 am-4:30 pm Sat-Sun 7:00 am-11:30 am

## 2017-02-23 DIAGNOSIS — R1084 Generalized abdominal pain: Secondary | ICD-10-CM

## 2017-02-23 LAB — BASIC METABOLIC PANEL
Anion gap: 10 (ref 5–15)
BUN: <5 mg/dL — ABNORMAL LOW (ref 6–20)
CALCIUM: 8 mg/dL — AB (ref 8.9–10.3)
CHLORIDE: 103 mmol/L (ref 101–111)
CO2: 29 mmol/L (ref 22–32)
CREATININE: 0.52 mg/dL (ref 0.44–1.00)
GFR calc Af Amer: >60 mL/min (ref >60)
GFR calc non Af Amer: >60 mL/min (ref >60)
GLUCOSE: 90 mg/dL (ref 65–99)
Potassium: 3.3 mmol/L — ABNORMAL LOW (ref 3.5–5.1)
Sodium: 142 mmol/L (ref 135–145)

## 2017-02-23 LAB — MAGNESIUM: Magnesium: 1.8 mg/dL (ref 1.7–2.4)

## 2017-02-23 MED ORDER — TRAZODONE HCL 50 MG PO TABS
25.0000 mg | ORAL_TABLET | Freq: Every day | ORAL | Status: DC
  Administered 2017-02-23: 25 mg via ORAL
  Filled 2017-02-23: qty 1

## 2017-02-23 MED ORDER — LEVOTHYROXINE SODIUM 50 MCG PO TABS
50.0000 ug | ORAL_TABLET | Freq: Every day | ORAL | Status: DC
  Administered 2017-02-24: 50 ug via ORAL
  Filled 2017-02-23: qty 1

## 2017-02-23 MED ORDER — POTASSIUM CHLORIDE CRYS ER 20 MEQ PO TBCR
20.0000 meq | EXTENDED_RELEASE_TABLET | Freq: Two times a day (BID) | ORAL | Status: DC
  Administered 2017-02-23 – 2017-02-24 (×3): 20 meq via ORAL
  Filled 2017-02-23 (×3): qty 1

## 2017-02-23 MED ORDER — PANTOPRAZOLE SODIUM 40 MG PO TBEC
40.0000 mg | DELAYED_RELEASE_TABLET | Freq: Every day | ORAL | Status: DC
  Administered 2017-02-23: 40 mg via ORAL
  Filled 2017-02-23: qty 1

## 2017-02-23 NOTE — Progress Notes (Signed)
PROGRESS NOTE  Cheyenne Cole  WUJ:[id number] DOB: [birth year] DOA: 02/15/2017 PCP: Nadara Eaton, MD  Brief Narrative: Cheyenne Cole is a 71 y.o. female with a history of IBS, colon polyps, uterine CA s/p TAH/BSO/LN dissection 2009, remote appendectomy who presented to the ED 10/1 with abdominal pain, nausea, vomiting, and low volume diarrhea. Imaging suggested thickened loops of bowel. Antibiotics for enteritis were started and she was admitted. Abd XR has demonstrated worsening SBO, thought to be partial due to ongoing diarrhea. Surgery was consulted 10/6 and small bowel protocol demonstrated failure of contrast to reach distal small bowel. NG tube placed 10/7 with improvement, clamped and pulled 10/8, advance to soft 10/9.     Assessment & Plan: Principal Problem:   SBO (small bowel obstruction) (HCC) Active Problems:   Enteritis presumed infectious   Hypokalemia  SBO due to enteritis: Gastrografin protocol with contrast only reaching stomach and proximal small bowel 10/6 > placed NGT 10/7, confirmatory XR showed contrast in colon. - Appreciate general surgery recommendations: Advance to soft. If tolerates, will DC 10/10.  - Saline lock IV - s/p 7d ceftriaxone, flagyl. Leukocytosis resolved. No blood cultures.  - Stool O&P pending  Hypokalemia: Improving, due to GI losses.  - Replace po.    Leg swelling: Symmetric, nonpitting. No crackles. Symmetric, nontender doubt VTE. - DC IVF  GERD and IBS: Chronic, stable - PPI po  Depression, anxiety:  - Resume po meds including buspar, lexapro, neurontin qHS - Improved sleep with trazodone , requests decrease to .   Hypothyroidism: Stable.  - Convert back to po synthroid   DVT prophylaxis: Lovenox Code Status: Full Family Communication: Sister at bedside Disposition Plan: [facility] 10/10  Consultants:   General surgery  Procedures:   None  Antimicrobials:  Ceftriaxone, flagyl 10/2 - 10/9    Subjective: NG  out tolerating liquids, had mild abd pain with grits/soft this AM. Slept much better. +BM, flatus. No emesis  Objective: BP (!) 152/82 (BP Location: Left Arm)   Pulse 99   Temp 98.8 F (37.1 C) (Oral)   Resp 18   Ht  (1.549 m)   Wt 56.7 kg (125 lb)   SpO2 96%   BMI 23.62 kg/m   Gen: 71 y.o. female in no distress walking around room Nose: NGT in situ w/light green output.  Pulm: Non-labored breathing room air. Clear to auscultation bilaterally.  CV: Regular rate and rhythm. No murmur, rub, or gallop. No JVD, no pedal edema. GI: Abdomen soft, nontender, mildly distended, Normoactive bowel sounds. No organomegaly or masses felt. Ext: Warm, no deformities Skin: No rashes, lesions no ulcers Neuro: Alert and oriented. No focal neurological deficits. Psych: Judgement and insight appear normal. Mood & affect appropriate.   CBC:  Recent Labs Lab 02/17/17 0540 02/17/17 2139 02/18/17 0522 02/21/17 1002 02/22/17 0723  WBC 20.7* 17.1* 13.9* 9.8 7.3  HGB 14.8 12.6 11.1* 10.2* 10.0*  HCT 43.6 37.6 33.0* 29.6* 29.5*  MCV 92.8 91.7 91.9 91.4 91.9  PLT 223 218 211 249 257   Basic Metabolic Panel:  Recent Labs Lab 02/17/17 0540 02/18/17 0522 02/21/17 1002 02/22/17 0723 02/23/17 0608  NA 136 139 138 138 142  K 4.0 3.6 2.3* 2.8* 3.3*  CL 107 113* 100* 96* 103  CO2 23 21* GLUCOSE 138* 105* 113* 120* 90  BUN 27* 24* 10 <5* <5*  CREATININE 0.99 0.60 0.60 0.41* 0.52  CALCIUM 7.5* 7.2* 7.2* 7.0* 8.0*  MG  --   --   --  1.8 1.8   Dg Abd Portable 1v  Result Date: 02/21/2017 CLINICAL DATA:  Nasogastric tube placement. EXAM: PORTABLE ABDOMEN - 1 VIEW COMPARISON:  02/20/2017 FINDINGS: Nasogastric tube passes well below the diaphragm. Tip projects in the right upper quadrant either in the distal stomach or first portion of the duodenum. There is contrast in the colon. There are mildly prominent loops small bowel centrally stable from the prior exam. IMPRESSION: Nasogastric  tube tip lies in the distal stomach or first portion of the duodenum. Electronically Signed   By: Amie Portland M.D.   On: 02/21/2017 12:39     LOS: 7 days   Time spent: 25 minutes.  Hazeline Junker, MD [facility] Pager [phone]  If 7PM-7AM, please contact night-coverage [email or web address] Password [email or web address] 02/23/2017, 11:42 AM

## 2017-02-23 NOTE — Progress Notes (Signed)
[facility]/Trauma Progress Note      Assessment/Plan Principal Problem:   SBO (small bowel obstruction) (HCC) Active Problems:   Enteritis presumed infectious   Hypokalemia  Partial SBO likely 2/2 enteritis - pt is having loose stools and flatus - last xray on 10/07 showed contrast in the colon - tolerating diet  FEN: soft diet VTE: per medicine ID: Rocephin and Flagyl 10/02>>10/09 Follow up: TBD  DISPO: advance diet to soft diet. Stopped IV abx. Stopped fluids and ordered PO potassium. If pt tolerates a soft diet she will be clear for discharge from a surgical standpoint.     LOS: 7 days    Subjective:  CC: loose stools  Pt is having very mild abdominal pain with onset before a bowel movement. She is having multiple loose stools without blood and tolerating full liquids. Pt is concerned about the swelling in her feet and ankles. She states she does not normally have swelling in her feet. She denies fever, chills, nausea or vomiting.   Objective: Vital signs in last 24 hours: Temp:  [98.3 F (36.8 C)-99 F (37.2 C)] 98.8 F (37.1 C) (10/09 0545) Pulse Rate:  [68-99] 99 (10/09 0545) Resp:  [15-18] 18 (10/09 0545) BP: (147-152)/(77-82) 152/82 (10/09 0545) SpO2:  [96 %-98 %] 96 % (10/09 0545) Last BM Date: 02/23/17  Intake/Output from previous day: 10/08 0701 - 10/09 0700 In: 2600 [I.V.:2100; IV Piggyback:500] Out: 600 [Urine:600] Intake/Output this shift: Total I/O In: 120 [P.O.:120] Out: -   PE: Gen:  Alert, NAD, pleasant, cooperative Card:  RRR, no M/G/R heard Pulm:  rate and effort normal Abd: Soft, not distended, +BS, very mild generalized TTP, no guarding Skin: no rashes noted, warm and dry   Anti-infectives: Anti-infectives    Start     Dose/Rate Route Frequency Ordered Stop   02/16/17 0600  cefTRIAXone (ROCEPHIN) 2 g in dextrose 5 % 50 mL IVPB - Premix  Status:  Discontinued     2 g 100 mL/hr over 30 Minutes Intravenous Every 24  hours 02/16/17 0547 02/23/17 0940   02/16/17 0600  metroNIDAZOLE (FLAGYL) IVPB 500 mg  Status:  Discontinued     500 mg 100 mL/hr over 60 Minutes Intravenous Every 8 hours 02/16/17 0547 02/23/17 0940      Lab Results:   Recent Labs  02/21/17 1002 02/22/17 0723  WBC 9.8 7.3  HGB 10.2* 10.0*  HCT 29.6* 29.5*  PLT 249 257   BMET  Recent Labs  02/22/17 0723 02/23/17 0608  NA 138 142  K 2.8* 3.3*  CL 96* 103  CO2 31 29  GLUCOSE 120* 90  BUN <5* <5*  CREATININE 0.41* 0.52  CALCIUM 7.0* 8.0*   PT/INR No results for input(s): LABPROT, INR in the last 72 hours. CMP     Component Value Date/Time   NA 142 02/23/2017 0608   K 3.3 (L) 02/23/2017 0608   CL 103 02/23/2017 0608   CO2 29 02/23/2017 0608   GLUCOSE 90 02/23/2017 0608   BUN <5 (L) 02/23/2017 0608   CREATININE 0.52 02/23/2017 0608   CALCIUM 8.0 (L) 02/23/2017 0608   PROT 7.6 02/16/2017 0002   ALBUMIN 4.4 02/16/2017 0002   AST 27 02/16/2017 0002   ALT 18 02/16/2017 0002   ALKPHOS 72 02/16/2017 0002   BILITOT 0.3 02/16/2017 0002   GFRNONAA >60 02/23/2017 0608   GFRAA >60 02/23/2017 0608   Lipase  No results found for: LIPASE  Studies/Results: Dg Abd Portable 1v  Result Date: 02/21/2017 CLINICAL DATA:  Nasogastric tube placement. EXAM: PORTABLE ABDOMEN - 1 VIEW COMPARISON:  02/20/2017 FINDINGS: Nasogastric tube passes well below the diaphragm. Tip projects in the right upper quadrant either in the distal stomach or first portion of the duodenum. There is contrast in the colon. There are mildly prominent loops small bowel centrally stable from the prior exam. IMPRESSION: Nasogastric tube tip lies in the distal stomach or first portion of the duodenum. Electronically Signed   By: Amie Portland M.D.   On: 02/21/2017 12:39      Jerre Simon , [facility] Surgery 02/23/2017, 9:42 AM Pager: [phone] Consults: [phone] Mon-Fri 7:00 am-4:30 pm Sat-Sun 7:00 am-11:30 am

## 2017-02-23 NOTE — Progress Notes (Signed)
This CM met with pt at bedside for dc planning. Pt is from [place] and states that she has a SNF bed held at [facility] for discharge. Pt states that if she discharges today or tomorrow she would like to go to the SNF part of [facility] to get stronger prior to going back to her apartment. CSW given above information. Marney Doctor RN,BSN,NCM [phone]

## 2017-02-23 NOTE — Progress Notes (Signed)
CSW following to assist with disposition.  Was notified pt's preference at DC would be to go to SNF bed at [facility] prior to returning to her independent living apartment there.  Spoke with facility- they are also in contact with pt during this hospitalization and were aware of her request. Looking into options as currently therapy evaluation does not reflect skilled need (pt ambulating 285 ft). Will call back and advise if skilled building will be available to pt at DC.   Will follow.  Ilean Skill, MSW, LCSW Clinical Social Work 02/23/2017 [phone] coverage for [phone]

## 2017-02-24 DIAGNOSIS — R52 Pain, unspecified: Secondary | ICD-10-CM

## 2017-02-24 DIAGNOSIS — R109 Unspecified abdominal pain: Secondary | ICD-10-CM

## 2017-02-24 LAB — BASIC METABOLIC PANEL
ANION GAP: 9 (ref 5–15)
BUN: <5 mg/dL — ABNORMAL LOW (ref 6–20)
CALCIUM: 8.1 mg/dL — AB (ref 8.9–10.3)
CO2: 29 mmol/L (ref 22–32)
Chloride: 104 mmol/L (ref 101–111)
Creatinine, Ser: 0.49 mg/dL (ref 0.44–1.00)
GLUCOSE: 97 mg/dL (ref 65–99)
POTASSIUM: 3.4 mmol/L — AB (ref 3.5–5.1)
SODIUM: 142 mmol/L (ref 135–145)

## 2017-02-24 LAB — MAGNESIUM: MAGNESIUM: 1.7 mg/dL (ref 1.7–2.4)

## 2017-02-24 MED ORDER — TRAZODONE HCL 50 MG PO TABS: 25.0000 mg | ORAL_TABLET | Freq: Every evening | ORAL | 0 refills | Status: AC | PRN

## 2017-02-24 NOTE — Final Consult Note (Signed)
Consultant Final Sign-Off Note    Assessment/Final recommendations  Cheyenne Cole is a 71 y.o. female followed by me for SBO   Wound care (if applicable):    Diet at discharge: soft diet for 1 week   Activity at discharge: per primary team   Follow-up appointment:  None needed   Pending results:  [employer]     Ordered   02/22/17 0836  OVA + PARASITE EXAM  Once,   R     02/22/17 0835   02/22/17 0500  Basic metabolic panel  Daily,   R    Question:  Specimen collection method  Answer:  Lab=Lab collect   02/21/17 1144   02/22/17 0500  Magnesium  Daily,   R    Question:  Specimen collection method  Answer:  Lab=Lab collect   02/21/17 1144       Medication recommendations:   Other recommendations:    Thank you for allowing [place] to participate in the care of your patient!  Please consult [place] again if you have further needs for your patient.  Joyce Copa Marsi Turvey 02/24/2017 9:18 AM    Subjective   CC: SBO  Pt having flatus and BM's yesterday. Tolerating diet. No abdominal pain but occasional cramping. She feels her ankles are a little less swollen.   Objective  Vital signs in last 24 hours: Temp:  [98.1 F (36.7 C)-98.9 F (37.2 C)] 98.4 F (36.9 C) (10/10 0613) Pulse Rate:  [80-95] 95 (10/10 0613) Resp:  [16-18] 16 (10/10 0613) BP: (127-158)/(78-85) 131/79 (10/10 0613) SpO2:  [97 %-100 %] 97 % (10/10 4098)  PE:  Gen: Alert, NAD, pleasant, cooperative Card: RRR, no M/G/R heard Pulm: CTA b/l, no wheezes or rales, rate and effort normal Abd: Soft, not distended,+BS, no TTP, no guarding Skin: no rashes noted, warm and dry Extremities: BUE edema 1+ pitting, no TTP to calves b/l    Pertinent labs and Studies:  Recent Labs  02/21/17 1002 02/22/17 0723  WBC 9.8 7.3  HGB 10.2* 10.0*  HCT 29.6* 29.5*   BMET  Recent Labs  02/23/17 0608 02/24/17 0459  NA 142 142  K 3.3* 3.4*  CL 103 104  CO2 29 29  GLUCOSE 90 97  BUN <5* <5*   CREATININE 0.52 0.49  CALCIUM 8.0* 8.1*   No results for input(s): LABURIN in the last 72 hours. No results found for this or any previous visit.  Imaging: No results found.   Mattie Marlin, [facility] Pager [phone]

## 2017-02-24 NOTE — Discharge Instructions (Signed)
Soft-Food Meal Plan Follow for one week after discharge Take Miralax as needed for moderate constipation A soft-food meal plan includes foods that are safe and easy to swallow. This meal plan typically is used:  If you are having trouble chewing or swallowing foods.  As a transition meal plan after only having had liquid meals for a long period.  What do I need to know about the soft-food meal plan? A soft-food meal plan includes tender foods that are soft and easy to chew and swallow. In most cases, bite-sized pieces of food are easier to swallow. A bite-sized piece is about  inch or smaller. Foods in this plan do not need to be ground or pureed. Foods that are very hard, crunchy, or sticky should be avoided. Also, breads, cereals, yogurts, and desserts with nuts, seeds, or fruits should be avoided. What foods can I eat? Grains Rice and wild rice. Moist bread, dressing, pasta, and noodles. Well-moistened dry or cooked cereals, such as farina (cooked wheat cereal), oatmeal, or grits. Biscuits, breads, muffins, pancakes, and waffles that have been well moistened. Vegetables Shredded lettuce. Cooked, tender vegetables, including potatoes without skins. Vegetable juices. Broths or creamed soups made with vegetables that are not stringy or chewy. Strained tomatoes (without seeds). Fruits Canned or well-cooked fruits. Soft (ripe), peeled fresh fruits, such as peaches, nectarines, kiwi, cantaloupe, honeydew melon, and watermelon (without seeds). Soft berries with small seeds, such as strawberries. Fruit juices (without pulp). Meats and Other Protein Sources Moist, tender, lean beef. Mutton. Lamb. Veal. Chicken. [place]. Liver. Ham. Fish without bones. Eggs. Dairy Milk, milk drinks, and cream. Plain cream cheese and cottage cheese. Plain yogurt. Sweets/Desserts Flavored gelatin desserts. Custard. Plain ice cream, frozen yogurt, sherbet, milk shakes, and malts. Plain cakes and cookies. Plain hard  candy. Other Butter, margarine (without trans fat), and cooking oils. Mayonnaise. Cream sauces. Mild spices, salt, and sugar. Syrup, molasses, honey, and jelly. The items listed above may not be a complete list of recommended foods or beverages. Contact your dietitian for more options. What foods are not recommended? Grains Dry bread, toast, crackers that have not been moistened. Coarse or dry cereals, such as bran, granola, and shredded wheat. Tough or chewy crusty breads, such as [place] bread or baguettes. Vegetables Corn. Raw vegetables except shredded lettuce. Cooked vegetables that are tough or stringy. Tough, crisp, fried potatoes and potato skins. Fruits Fresh fruits with skins or seeds or both, such as apples, pears, or grapes. Stringy, high-pulp fruits, such as papaya, pineapple, coconut, or mango. Fruit leather, fruit roll-ups, and all dried fruits. Meats and Other Protein Sources Sausages and hot dogs. Meats with gristle. Fish with bones. Nuts, seeds, and chunky peanut or other nut butters. Sweets/Desserts Cakes or cookies that are very dry or chewy. The items listed above may not be a complete list of foods and beverages to avoid. Contact your dietitian for more information. This information is not intended to replace advice given to you by your health care provider. Make sure you discuss any questions you have with your health care provider. Document Released: 08/11/2007 Document Revised: 10/10/2015 Document Reviewed: 03/31/2013 Elsevier Interactive Patient Education  2017 [employer].

## 2017-02-24 NOTE — [place] FL2 (Signed)
[place] MEDICAID FL2 LEVEL OF CARE SCREENING TOOL     IDENTIFICATION  Patient Name: Cheyenne Cole Birthdate: 10-Apr-1946 Sex: female Admission Date (Current Location): 02/15/2017  [facility] and [place] Number:  Producer, television/film/video and Address:  [facility],  [address] [place], [place] [address]      Provider Number: [id number]  Attending Physician Name and Address:  Tyrone Nine, MD  Relative Name and Phone Number:  Hammerstrom,Bob,[phone]    Current Level of Care: Hospital Recommended Level of Care: Skilled Nursing Facility Prior Approval Number:    Date Approved/Denied:   PASRR Number: [id number]  Discharge Plan: SNF    Current Diagnoses: Patient Active Problem List   Diagnosis Date Noted  . SBO (small bowel obstruction) (HCC) 02/16/2017  . Enteritis presumed infectious 02/16/2017  . Hypokalemia 02/16/2017    Orientation RESPIRATION BLADDER Height & Weight     Self, Time, Situation, Place  Normal Continent Weight: 125 lb (56.7 kg) Height:   (154.9 cm)  BEHAVIORAL SYMPTOMS/MOOD NEUROLOGICAL BOWEL NUTRITION STATUS   (No behaviors)   Continent Diet (Soft Diet/Low Fiber)  AMBULATORY STATUS COMMUNICATION OF NEEDS Skin   Limited Assist Verbally Normal                       Personal Care Assistance Level of Assistance  Bathing, Feeding, Dressing Bathing Assistance: Independent Feeding assistance: Independent Dressing Assistance: Independent     Functional Limitations Info  Sight, Hearing, Speech Sight Info: Adequate Hearing Info: Adequate Speech Info: Adequate    SPECIAL CARE FACTORS FREQUENCY        PT Frequency: 3x/min week              Contractures Contractures Info: Not present    Additional Factors Info  Code Status, Allergies, Psychotropic Code Status Info: Fullcode Allergies Info: Ciprofloxacin, Wellbutrin Bupropion, Adhesive Tape, Betadine Povidone Iodine, Nsaids, Other, Prednisone, Soap Psychotropic Info:  Buspar, Trazadone         Current Medications (02/24/2017):  This is the current hospital active medication list Current Facility-Administered Medications  Medication Dose Route Frequency Provider Last Rate Last Dose  . acetaminophen (TYLENOL) tablet 650 mg  650 mg Oral Q6H PRN Hillary Bow, DO   650 mg at 02/22/17 2025   Or  . acetaminophen (TYLENOL) suppository 650 mg  650 mg Rectal Q6H PRN Hillary Bow, DO      . busPIRone (BUSPAR) tablet 10 mg  10 mg Oral BID WC Almond Lint, MD   10 mg at 02/24/17 0734   And  . busPIRone (BUSPAR) tablet 5 mg  5 mg Oral QHS Almond Lint, MD   5 mg at 02/23/17 2117  . cycloSPORINE (RESTASIS) 0.05 % ophthalmic emulsion 1 drop  1 drop Both Eyes BID Otho Bellows, RPH   1 drop at 02/24/17 8413  . dicyclomine (BENTYL) capsule 10 mg  10 mg Oral QID PRN Alwyn Ren, MD   10 mg at 02/21/17 0247  . dorzolamide-timolol (COSOPT) 22.3-6.8 MG/ML ophthalmic solution 1 drop  1 drop Both Eyes BID Alison Murray, MD   1 drop at 02/24/17 0929  . escitalopram (LEXAPRO) tablet 20 mg  20 mg Oral Daily Alwyn Ren, MD   20 mg at 02/24/17 2440  . fluticasone (FLONASE) 50 MCG/ACT nasal spray 1 spray  1 spray Each Nare Daily Alwyn Ren, MD      . gabapentin (NEURONTIN) capsule 300 mg  300 mg Oral  Comer Locket, MD   300 mg at 02/23/17 2117  . HYDROmorphone (DILAUDID) injection 0.5 mg  0.5 mg Intravenous Q2H PRN Hillary Bow, DO   0.5 mg at 02/22/17 0827  . levothyroxine (SYNTHROID, LEVOTHROID) tablet 50 mcg  50 mcg Oral QAC breakfast Tyrone Nine, MD   50 mcg at 02/24/17 0734  . ondansetron (ZOFRAN) tablet 4 mg  4 mg Oral Q6H PRN Hillary Bow, DO   4 mg at 02/19/17 1534   Or  . ondansetron (ZOFRAN) injection 4 mg  4 mg Intravenous Q6H PRN Hillary Bow, DO   4 mg at 02/21/17 0143  . pantoprazole (PROTONIX) EC tablet 40 mg  40 mg Oral QHS Tyrone Nine, MD   40 mg at 02/23/17 2117  . phenol (CHLORASEPTIC) mouth spray 1 spray   1 spray Mouth/Throat PRN Almond Lint, MD      . polyvinyl alcohol (LIQUIFILM TEARS) 1.4 % ophthalmic solution 1 drop  1 drop Both Eyes Daily PRN Alison Murray, MD   1 drop at 02/17/17 0954  . potassium chloride SA (K-DUR,KLOR-CON) CR tablet 20 mEq  20 mEq Oral BID Mattie Marlin L, PA   20 mEq at 02/24/17 0927  . sodium chloride (OCEAN) 0.65 % nasal spray 1 spray  1 spray Each Nare PRN Almond Lint, MD      . traZODone (DESYREL) tablet 25 mg  25 mg Oral QHS Tyrone Nine, MD   25 mg at 02/23/17 2118     Discharge Medications: Please see discharge summary for a list of discharge medications.  Relevant Imaging Results:  Relevant Lab Results:   Additional Information SSN:[id number]  Clearance Coots, LCSW

## 2017-02-24 NOTE — Progress Notes (Signed)
Report given to Florentina Addison, Charity fundraiser at [facility]. All questions answered. Discharge instructions were reviewed with the patient. All questions were answered. Pt was wheeled to [employer] with belongings by Psychologist, sport and exercise.

## 2017-02-24 NOTE — Discharge Summary (Signed)
Physician Discharge Summary  Cheyenne Cole ONG:[id number] DOB: Oct 24, 1945 DOA: 02/15/2017  PCP: Nadara Eaton, MD  Admit date: 02/15/2017 Discharge date: 02/24/2017  Admitted From: [facility] Disposition: [facility]   Recommendations for Outpatient Follow-up:  1. Follow up with PCP in 1-2 weeks 2. Please obtain BMP in 1 week to monitor potassium levels. 3. Please follow up on the following pending results: ova and parasite exam  Home Health: N/A Equipment/Devices: Per SNF Discharge Condition: Stable CODE STATUS: Full Diet recommendation: Soft x1 week  Brief/Interim Summary: Cheyenne Cole is a 71 y.o. female with a history of IBS, colon polyps, uterine CA s/p TAH/BSO/LN dissection 2009, remote appendectomy who presented to the ED 10/1 with abdominal pain, nausea, vomiting, and low volume diarrhea. Imaging suggested thickened loops of bowel. Antibiotics for enteritis were started and she was admitted. Abd XR has demonstrated worsening SBO, thought to be partial due to ongoing diarrhea. Surgery was consulted 10/6 and small bowel protocol demonstrated failure of contrast to reach distal small bowel. NG tube placed 10/7 with improvement, clamped and pulled 10/8, advance to soft 10/9 and has tolerated a soft diet x48 hours without recurrence of symptoms.   Discharge Diagnoses:  Principal Problem:   SBO (small bowel obstruction) (HCC) Active Problems:   Enteritis presumed infectious   Hypokalemia  SBO due to enteritis: Gastrografin protocol with contrast only reaching stomach and proximal small bowel 10/6 > placed NGT 10/7, confirmatory XR showed contrast in colon. - Appreciate general surgery recommendations: Advance to soft which she's tolerated, so will DC 10/10.  - s/p 7d ceftriaxone, flagyl. Leukocytosis resolved. No blood cultures.  - Stool O&P pending  Hypokalemia: Improving, was due to GI losses/poor po.  - Replaced po while here, but po intake is picking up. No supplement  will be continued at discharge.     Leg swelling: Symmetric, nonpitting. No crackles. Symmetric, nontender doubt VTE. Stopped IVF's and this has been stable. Cr wnl, albumin wnl, no evidence of CHF.  - Elevated legs  GERD and IBS: Chronic, stable - PPI po  Depression, anxiety:  - Resume po meds including buspar, lexapro, neurontin qHS - Improved sleep with trazodone 25mg  qHS which will be continued prn at discharge per pt request.  Hypothyroidism: Stable.  - Continue po synthroid   Discharge Instructions Discharge Instructions    Call MD for:  difficulty breathing, headache or visual disturbances    Complete by:  As directed    Call MD for:  persistant nausea and vomiting    Complete by:  As directed    Call MD for:  temperature >100.4    Complete by:  As directed    Discharge instructions    Complete by:  As directed    Continue with soft diet for the next week.  Recheck blood work (potassium level) in the next week  May take trazodone 25mg  at bedtime as needed. Keep your legs elevated to help with swelling.   Increase activity slowly    Complete by:  As directed      Allergies as of 02/24/2017      Reactions   Ciprofloxacin Swelling   Tongue swelling   Wellbutrin [bupropion] Swelling   "thick tongue sensation" with all SNRIs (SSRIs okay)   Adhesive [tape] Itching   Betadine [povidone Iodine] Other (See Comments)   Can NOT use on genital area d/t irritation, itching, and yeast infection Other areas of skin okay   Nsaids Other (See Comments)   Gi upsets; flushes;  acid reflux ONLY can take ibuprofen (1 low dose in one week)   Other Other (See Comments)   Eye drops with preservatives cause irritation to eyes   Prednisone Other (See Comments)   Flushing, easy bruising, GI upset Avoid d/t glaucoma   Soap Itching      Medication List    TAKE these medications   acetaminophen 500 MG tablet Commonly known as:  TYLENOL Take 250-500 mg by mouth every 8 (eight)  hours as needed for mild pain.   busPIRone 15 MG tablet Commonly known as:  BUSPAR Take 5-7.5 mg by mouth 3 (three) times daily. Take 7.5mg  by mouth twice daily (breakfast and dinner) and take  at bedtime   calcium carbonate 500 MG chewable tablet Commonly known as:  TUMS - dosed in mg elemental calcium Chew 1 tablet by mouth daily as needed for indigestion or heartburn.   chlorpheniramine 4 MG tablet Commonly known as:  CHLOR-TRIMETON Take 4 mg by mouth every 6 (six) hours as needed for allergies (or vertigo).   cholecalciferol 400 units Tabs tablet Commonly known as:  VITAMIN D Take 400 Units by mouth 2 (two) times daily.   COSOPT PF 22.3-6.8 MG/ML Soln ophthalmic solution Generic drug:  dorzolamidel-timolol Place 1 drop into both eyes 2 (two) times daily.   cycloSPORINE 0.05 % ophthalmic emulsion Commonly known as:  RESTASIS Place 1 drop into both eyes 2 (two) times daily.   dicyclomine 10 MG capsule Commonly known as:  BENTYL Take 10 mg by mouth daily as needed for spasms.   docusate sodium 100 MG capsule Commonly known as:  COLACE Take 100 mg by mouth 2 (two) times daily. Hold for loose stool   escitalopram 5 MG tablet Commonly known as:  LEXAPRO Take 5-15 mg by mouth 2 (two) times daily. Take  in the morning and  at dinner   FIBER FORMULA PO Take 1 capsule by mouth daily.   fluticasone 50 MCG/ACT nasal spray Commonly known as:  FLONASE Place 2 sprays into the nose daily.   gabapentin 100 MG capsule Commonly known as:  NEURONTIN Take 300 mg by mouth at bedtime.   guaifenesin 100 MG/5ML syrup Commonly known as:  ROBITUSSIN Take 100 mg by mouth 3 (three) times daily as needed for cough or congestion.   ketoconazole 2 % cream Commonly known as:  NIZORAL Apply 1 application topically daily as needed for irritation.   levothyroxine 50 MCG tablet Commonly known as:  SYNTHROID, LEVOTHROID Take 50 mcg by mouth daily.   Melatonin 3 MG Tabs Take 1-2  mg by mouth at bedtime as needed (sleep). Take  at bedtime as needed for sleep. May take another  (for  total) if needed.   METROGEL EX Apply 1 application topically every morning. Apply to face every morning   omeprazole 20 MG capsule Commonly known as:  PRILOSEC Take 20 mg by mouth daily.   OVER THE COUNTER MEDICATION Take 1 tablet by mouth 2 (two) times daily. OTC calcium combination supplement- (Calcium 900, Magnesium 333, Vit D 400, Zinc 9 per two capsules)   polyethylene glycol packet Commonly known as:  MIRALAX / GLYCOLAX Take 17 g by mouth daily as needed for mild constipation.   PREVIDENT 5000 SENSITIVE 1.1-5 % Pste Generic drug:  Sod Fluoride-Potassium Nitrate Take 1 application by mouth daily.   REFRESH OP Place 1 drop into both eyes daily as needed (dry eyes).   sodium chloride 0.65 % Soln nasal spray Commonly known as:  OCEAN Place 1 spray into both nostrils as needed (nasal dryness).   traZODone 50 MG tablet Commonly known as:  DESYREL Take 0.5 tablets (25 mg total) by mouth at bedtime as needed for sleep.   TRIAMCINOLONE ACETONIDE EX Apply 1 application topically daily as needed (rash).   TUSSIN DM 10-100 MG/5ML liquid Generic drug:  dextromethorphan-guaiFENesin Take 5-10 mLs by mouth every 4 (four) hours as needed for cough.   VALTREX PO Take 1 tablet by mouth daily as needed (for fever blisters).   VYZULTA 0.024 % Soln Generic drug:  Latanoprostene Bunod Place 1 drop into both eyes every evening.       Contact information for follow-up providers    [facility] Follow up.   Specialty:  General Surgery Why:  no need for follow up appoitnment, call if you have any questions Contact information: [address] [place] [address] [phone]       Nadara Eaton, MD Follow up.   Specialty:  Internal Medicine           Contact information for after-discharge care    Destination     [facility] Follow up.   Specialty:  Skilled Nursing Facility Contact information: [address] [place] [address] [phone]                 Allergies  Allergen Reactions  . Ciprofloxacin Swelling    Tongue swelling  . Wellbutrin [Bupropion] Swelling    "thick tongue sensation" with all SNRIs (SSRIs okay)  . Adhesive [Tape] Itching  . Betadine [Povidone Iodine] Other (See Comments)    Can NOT use on genital area d/t irritation, itching, and yeast infection  Other areas of skin okay  . Nsaids Other (See Comments)    Gi upsets; flushes; acid reflux  ONLY can take ibuprofen (1 low dose in one week)  . Other Other (See Comments)    Eye drops with preservatives cause irritation to eyes  . Prednisone Other (See Comments)    Flushing, easy bruising, GI upset  Avoid d/t glaucoma  . Soap Itching    Consultations:  General surgery  Procedures/Studies: Dg Abd 1 View  Result Date: 02/20/2017 CLINICAL DATA:  Abdominal pain.  Followup small bowel obstruction. EXAM: ABDOMEN - 1 VIEW COMPARISON:  02/19/2017 and multiple previous. FINDINGS: Persistent small bowel obstruction pattern. Fluid and air-filled dilated small intestine appears similar. No progressive or new finding. IMPRESSION: Persistent small bowel obstruction pattern. Similar appearance to yesterday. Worsened compared to the presenting studies of 02/16/2017 and 02/17/2017 Electronically Signed   By: Paulina Fusi M.D.   On: 02/20/2017 06:59   Dg Abd 1 View  Result Date: 02/19/2017 CLINICAL DATA:  Follow up small bowel obstruction EXAM: ABDOMEN - 1 VIEW COMPARISON:  02/18/2017 FINDINGS: Stable small bowel dilatation is noted centrally within the abdomen. A few relatively normal distal loops of small bowel are noted. No free air is seen. Scoliosis is again noted. IMPRESSION: Stable small bowel obstructive change. Electronically Signed   By: Alcide Clever M.D.   On: 02/19/2017  09:43   Dg Abd 1 View  Result Date: 02/18/2017 CLINICAL DATA:  Abdominal pain and nausea EXAM: ABDOMEN - 1 VIEW COMPARISON:  Abdominal radiograph of October 18, 2016 FINDINGS: There are loops of moderately distended gas-filled small bowel in the mid and lower abdomen. The number of distended small bowel loops has increased. No distended colon is observed. No free extraluminal  gas collections are demonstrated. IMPRESSION: Findings consistent with a distal small bowel obstruction. The degree of distention and number of involved small bowel loops has increased since yesterday. Electronically Signed   By: David  [place] M.D.   On: 02/18/2017 13:31   Dg Abd 1 View  Result Date: 02/17/2017 CLINICAL DATA:  71 year old female with abdominal pain and nausea. Small bowel obstruction on CT Abdomen and Pelvis yesterday. EXAM: ABDOMEN - 1 VIEW COMPARISON:  CT Abdomen and Pelvis 02/16/2017. FINDINGS: Stable to mildly increased dilatation of gas-filled small bowel in the central lower abdomen compared to the CT yesterday (up to 3 cm diameter). Paucity of other small bowel gas may indicate continued fluid-filled loops. Continued paucity of large bowel gas. Surgical clips along the pelvic sidewalls and common iliac vessels. Possible increased atelectasis at both lung bases. Dextroconvex lumbar scoliosis. No acute osseous abnormality identified. IMPRESSION: 1. Continued small bowel obstruction, suspect not significantly changed since the CT Abdomen and Pelvis yesterday. 2. Lung base atelectasis. Electronically Signed   By: Odessa Fleming M.D.   On: 02/17/2017 10:46   Ct Abdomen Pelvis W Contrast  Result Date: 02/16/2017 CLINICAL DATA:  Nausea, vomiting, and diarrhea. Bloating. Prior colonoscopy with polyp removal. History of uterine cancer. Hematuria. EXAM: CT ABDOMEN AND PELVIS WITH CONTRAST TECHNIQUE: Multidetector CT imaging of the abdomen and pelvis was performed using the standard protocol following bolus administration of  intravenous contrast. CONTRAST:  ISOVUE-300 IOPAMIDOL (ISOVUE-300) INJECTION 61% COMPARISON:  None. FINDINGS: Lower chest: The lung bases are clear. Hepatobiliary: No focal liver abnormality is seen. No gallstones, gallbladder wall thickening, or biliary dilatation. Pancreas: Unremarkable. No pancreatic ductal dilatation or surrounding inflammatory changes. Spleen: Normal in size without focal abnormality. Adrenals/Urinary Tract: Adrenal glands are unremarkable. Kidneys are normal, without renal calculi, focal lesion, or hydronephrosis. Bladder is unremarkable. Stomach/Bowel: Stomach is not abnormally distended and no wall thickening is appreciated. There is mild a distention of fluid-filled small bowel particularly in the lower abdomen and pelvis. There is mild bowel wall thickening with a prominent thick walled bowel loop in the pelvis. There is decompression of the terminal ileum with transition zone in the pelvis or right lower quadrant. Changes are consistent with small bowel obstruction. Wall thickening suggests superimposed or pre-existing enteritis or inflammatory bowel disease. Can't exclude stricture from Crohn's disease. No pneumatosis or portal venous gas to suggest ischemia. Colon is mostly decompressed. No inflammatory changes in the colon. Appendix is not identified. Vascular/Lymphatic: Aortic atherosclerosis. No enlarged abdominal or pelvic lymph nodes. Reproductive: Status post hysterectomy. No adnexal masses. Other: No free air in the abdomen. Small amount of free fluid around the liver, possibly indicating ascites. Musculoskeletal: Lumbar scoliosis convex towards the right. No destructive bone lesions. IMPRESSION: 1. Mildly dilated fluid-filled small bowel with distal decompression and small bowel wall thickening. Changes consistent with small bowel obstruction. Coexisting enteritis. Can't exclude stricture due to Crohn's disease. Transition zone in the right lower quadrant pelvis. 2. No  evidence of diverticulitis. 3. Aortic atherosclerosis. 4. Small amount of ascites around the liver. Electronically Signed   By: Burman Nieves M.D.   On: 02/16/2017 02:16   Dg Abd Decub  Result Date: 02/20/2017 CLINICAL DATA:  Question free air on abdominal radiograph from earlier today. Small-bowel obstruction. EXAM: ABDOMEN - 1 VIEW DECUBITUS COMPARISON:  02/20/2017 abdominal radiographs. FINDINGS: Surgical clips are noted in the bilateral pelvis. There are moderate dilated small bowel loops with air-fluid levels throughout the abdomen, not appreciably changed. Oral contrast is seen layering  within the stomach and within several dilated small bowel loops in the left abdomen. The questionable right upper quadrant gas on the abdominal radiograph from earlier today correlates with a left upper quadrant bowel loop with air-fluid level, favor the hepatic flexure of the colon. No evidence of free intraperitoneal air on this view. IMPRESSION: No evidence of free intraperitoneal air. Persistent diffuse small bowel dilatation with air-fluid levels compatible with distal small bowel obstruction. Oral contrast is predominantly located in the stomach and proximal small bowel. Electronically Signed   By: Delbert Phenix M.D.   On: 02/20/2017 21:01   Dg Abd Portable 1v  Result Date: 02/21/2017 CLINICAL DATA:  Nasogastric tube placement. EXAM: PORTABLE ABDOMEN - 1 VIEW COMPARISON:  02/20/2017 FINDINGS: Nasogastric tube passes well below the diaphragm. Tip projects in the right upper quadrant either in the distal stomach or first portion of the duodenum. There is contrast in the colon. There are mildly prominent loops small bowel centrally stable from the prior exam. IMPRESSION: Nasogastric tube tip lies in the distal stomach or first portion of the duodenum. Electronically Signed   By: Amie Portland M.D.   On: 02/21/2017 12:39   Dg Abd Portable 1v-small Bowel Obstruction Protocol-initial, 8 Hr Delay  Result Date:  02/20/2017 CLINICAL DATA:  Inpatient.  Follow-up small bowel obstruction. EXAM: PORTABLE ABDOMEN - 1 VIEW COMPARISON:  Abdominal radiograph from earlier today. FINDINGS: Oral contrast is present predominantly in the gastric fundus. A small amount of oral contrast is seen in the duodenum. There is prominent diffuse small bowel dilatation throughout the abdomen up to 4.5 cm diameter, not appreciably changed. There is increased focal gas in the right upper quadrant with questionable Rigler's sign along a right paraspinal small bowel loop, cannot exclude new free air. Surgical clips are again noted bilaterally in the pelvis. IMPRESSION: 1. No appreciable change in diffuse prominent small bowel dilatation throughout the abdomen, compatible with persistent distal small bowel obstruction. 2. Increased focal gas in the right upper quadrant with questionable Rigler's sign involving a right paraspinal small bowel loop, cannot exclude new free intraperitoneal air. Recommend correlation with left lateral decubitus abdominal radiograph. These results were called by telephone at the time of interpretation on 02/20/2017 at 8:11 pm to Dr. Antionette Char, who verbally acknowledged these results. Electronically Signed   By: Delbert Phenix M.D.   On: 02/20/2017 20:18     Subjective: Abdominal pain is improved, only remnant is cramping on left side which is chronic attributed to scoliosis. No nausea or vomiting with soft diet. +flatus and BMs. No fever. Has been ambulating consistently, but wants to go to SNF.  Discharge Exam: Vitals:   02/23/17 2138 02/24/17 0613  BP: (!) 158/85 131/79  Pulse: 93 95  Resp: 18 16  Temp: 98.9 F (37.2 C) 98.4 F (36.9 C)  SpO2: 100% 97%   General: Pt is alert, awake, not in acute distress Cardiovascular: RRR, S1/S2 +, no rubs, no gallops. No JVD Respiratory: CTA bilaterally, no wheezing, no rhonchi Abdominal: Soft, NT, ND, bowel sounds + Extremities: Trace symmetric LE edema, negative homan's,  no cyanosis  Labs: Basic Metabolic Panel:  Recent Labs Lab 02/18/17 0522 02/21/17 1002 02/22/17 0723 02/23/17 0608 02/24/17 0459  NA 139 138 138 142 142  K 3.6 2.3* 2.8* 3.3* 3.4*  CL 113* 100* 96* 103 104  CO2 21* 26 31 29 29   GLUCOSE 105* 113* 120* 90 97  BUN 24* 10 <5* <5* <5*  CREATININE 0.60 0.60 0.41* 0.52 0.49  CALCIUM 7.2* 7.2* 7.0* 8.0* 8.1*  MG  --   --  1.8 1.8 1.7   CBC:  Recent Labs Lab 02/17/17 2139 02/18/17 0522 02/21/17 1002 02/22/17 0723  WBC 17.1* 13.9* 9.8 7.3  HGB 12.6 11.1* 10.2* 10.0*  HCT 37.6 33.0* 29.6* 29.5*  MCV 91.7 91.9 91.4 91.9  PLT 218 211 249 257   Urinalysis    Component Value Date/Time   COLORURINE YELLOW 02/16/2017 0035   APPEARANCEUR CLOUDY (A) 02/16/2017 0035   LABSPEC 1.010 02/16/2017 0035   PHURINE 8.5 (H) 02/16/2017 0035   GLUCOSEU NEGATIVE 02/16/2017 0035   HGBUR SMALL (A) 02/16/2017 0035   BILIRUBINUR NEGATIVE 02/16/2017 0035   KETONESUR 15 (A) 02/16/2017 0035   PROTEINUR NEGATIVE 02/16/2017 0035   NITRITE NEGATIVE 02/16/2017 0035   LEUKOCYTESUR SMALL (A) 02/16/2017 0035   Time coordinating discharge: Approximately 40 minutes  Hazeline Junker, MD  [facility] 02/24/2017, 10:15 AM Pager [phone]

## 2017-02-24 NOTE — Clinical Social Work Placement (Addendum)
   CLINICAL SOCIAL WORK PLACEMENT  NOTE  Date:  02/24/2017  Patient Details  Name: Cheyenne Cole MRN: [id number] Date of Birth: 1945-11-10  Clinical Social Work is seeking post-discharge placement for this patient at the Skilled  Nursing Facility level of care (*CSW will initial, date and re-position this form in  chart as items are completed):  No   Patient/family provided with [facility] Department's list of facilities offering this level of care within the geographic area requested by the patient (or if unable, by the patient's family).  No   Patient/family informed of their freedom to choose among providers that offer the needed level of care, that participate in Medicare, Medicaid or managed care program needed by the patient, have an available bed and are willing to accept the patient.  No   Patient/family informed of [facility]'s ownership interest in [facility] and [facility], as well as of the fact that they are under no obligation to receive care at these facilities.  PASRR submitted to EDS on 02/24/17     PASRR number received on 02/24/17     Existing PASRR number confirmed on       FL2 transmitted to all facilities in geographic area requested by pt/family on       FL2 transmitted to all facilities within larger geographic area on       Patient informed that his/her managed care company has contracts with or will negotiate with certain facilities, including the following:  [facility]     Yes   Patient/family informed of bed offers received.  Patient chooses bed at [facility]     Physician recommends and patient chooses bed at      Patient to be transferred to   on 02/24/17.  Patient to be transferred to facility by     [employer].   Patient family notified on   of transfer. Patient will notify family  Name of family member notified:     Patient will notify family.   PHYSICIAN       Additional  Comment:    _______________________________________________ Clearance Coots, LCSW 02/24/2017, 11:09 AM

## 2017-03-03 LAB — OVA + PARASITE EXAM

## 2017-03-03 LAB — O&P RESULT
